# Patient Record
Sex: Female | Born: 1945 | Race: White | Hispanic: No | Marital: Married | Smoking: Never smoker
Health system: Southern US, Community
[De-identification: ages and names within clinical notes are randomized; demographics above are authoritative.]

## PROBLEM LIST (undated history)

## (undated) DIAGNOSIS — Z950 Presence of cardiac pacemaker: Secondary | ICD-10-CM

## (undated) DIAGNOSIS — I10 Essential (primary) hypertension: Secondary | ICD-10-CM

## (undated) DIAGNOSIS — F419 Anxiety disorder, unspecified: Secondary | ICD-10-CM

## (undated) DIAGNOSIS — K219 Gastro-esophageal reflux disease without esophagitis: Secondary | ICD-10-CM

## (undated) DIAGNOSIS — T4145XA Adverse effect of unspecified anesthetic, initial encounter: Secondary | ICD-10-CM

## (undated) DIAGNOSIS — I509 Heart failure, unspecified: Secondary | ICD-10-CM

## (undated) DIAGNOSIS — M797 Fibromyalgia: Secondary | ICD-10-CM

## (undated) DIAGNOSIS — F329 Major depressive disorder, single episode, unspecified: Secondary | ICD-10-CM

## (undated) DIAGNOSIS — F32A Depression, unspecified: Secondary | ICD-10-CM

## (undated) DIAGNOSIS — G4730 Sleep apnea, unspecified: Secondary | ICD-10-CM

## (undated) DIAGNOSIS — M1990 Unspecified osteoarthritis, unspecified site: Secondary | ICD-10-CM

## (undated) DIAGNOSIS — R51 Headache: Secondary | ICD-10-CM

## (undated) HISTORY — PX: TONSILLECTOMY: SUR1361

## (undated) HISTORY — PX: EYE SURGERY: SHX253

---

## 1979-02-06 HISTORY — PX: ECTOPIC PREGNANCY SURGERY: SHX613

## 1988-10-06 HISTORY — PX: FRACTURE SURGERY: SHX138

## 2003-02-06 HISTORY — PX: GASTRIC BYPASS: SHX52

## 2009-02-05 DIAGNOSIS — T8859XA Other complications of anesthesia, initial encounter: Secondary | ICD-10-CM

## 2009-02-05 HISTORY — PX: DILATION AND CURETTAGE OF UTERUS: SHX78

## 2009-02-05 HISTORY — DX: Other complications of anesthesia, initial encounter: T88.59XA

## 2009-02-05 HISTORY — PX: JOINT REPLACEMENT: SHX530

## 2011-02-06 HISTORY — PX: WRIST SURGERY: SHX841

## 2013-06-24 ENCOUNTER — Other Ambulatory Visit: Payer: Self-pay | Admitting: Surgical

## 2013-06-24 MED ORDER — TRANEXAMIC ACID 100 MG/ML IV SOLN: 1000.0000 mg | INTRAVENOUS | Status: AC

## 2013-06-24 NOTE — H&P (Signed)
TOTAL HIP REVISION ADMISSION H&P  Patient is admitted for right revision total hip arthroplasty.  Subjective:  Chief Complaint: right hip pain  HPI: Sylvia Thomas, 68 y.o. female, has a history of pain and functional disability in the right hip due to poor bony ingrowth into the prosthesis and patient has failed non-surgical conservative treatments for greater than 12 weeks to include NSAID's and/or analgesics, flexibility and strengthening excercises, supervised PT with diminished ADL's post treatment, use of assistive devices and activity modification. The indications for the revision total hip arthroplasty are loosening of one or more components.  Onset of symptoms was gradual starting 3 years ago with gradually worsening course since that time.  Prior procedures on the right hip include arthroplasty.  Patient currently rates pain in the right hip at 8 out of 10 with activity.  There is night pain, worsening of pain with activity and weight bearing, pain that interfers with activities of daily living and pain with passive range of motion. Patient has evidence of prosthetic loosening by imaging studies.  This condition presents safety issues increasing the risk of falls.  There is no current active infection.  Past Medical History Chronic Pain Congestive Heart Failure Depression Fibromyalgia Gastroesophageal Reflux Disease Osteoarthritis Osteoporosis Psoriatic Arthritis Sleep Apnea Hypotension Marginal Ulcer Migraines  Past Surgical History Ankle Surgery. left Cataract Surgery. right Dilation and Curettage of Uterus - Multiple Foot Surgery. left Tonsillectomy Total Hip Replacement. right  NKDA  History  Substance Use Topics  . Smoking status: None smoker  . Smokeless tobacco: No use  . Alcohol Use: Rare    Medication History Eszopiclone (3MG  Tablet, Oral) Active. Lyrica (75MG  Capsule, Oral) Active. Carvedilol (3.125MG  Tablet, Oral) Active. Lyrica (75MG  Capsule,  Oral) Active. Pravastatin Sodium (20MG  Tablet, Oral) Active. Lunesta (3MG  Tablet, Oral) Active. Nystop (100000 UNIT/GM Powder, External) Active. Mycostatin (100000 UNIT/GM Powder, External) Active. Furosemide (20MG  Tablet, Oral) Active. Losartan Potassium (25MG  Tablet, Oral) Active. Omeprazole (20MG  Capsule DR, Oral) Active. Cyclobenzaprine HCl (10MG  Tablet, Oral) Active. DULoxetine HCl (60MG  Capsule DR Part, Oral) Active. Cefdinir (300MG  Capsule, Oral) Active. Spironolactone (25MG  Tablet, Oral) Active.  Review of Systems  Constitutional: Positive for malaise/fatigue. Negative for fever, chills, weight loss and diaphoresis.  HENT: Negative.   Eyes: Negative.   Respiratory: Positive for shortness of breath. Negative for cough, hemoptysis, sputum production and wheezing.        SOB on exertion  Cardiovascular: Negative.   Gastrointestinal: Negative.   Genitourinary: Negative.   Musculoskeletal: Positive for back pain, joint pain and myalgias. Negative for falls and neck pain.       Right hip pain  Skin: Negative.   Neurological: Negative.  Negative for weakness.  Endo/Heme/Allergies: Negative.   Psychiatric/Behavioral: Negative for depression, suicidal ideas, hallucinations, memory loss and substance abuse. The patient has insomnia. The patient is not nervous/anxious.     Objective:  Physical Exam  Constitutional: She is oriented to person, place, and time. She appears well-developed and well-nourished. No distress.  HENT:  Head: Normocephalic and atraumatic.  Right Ear: External ear normal.  Left Ear: External ear normal.  Nose: Nose normal.  Mouth/Throat: Oropharynx is clear and moist.  Eyes: Conjunctivae and EOM are normal.  Neck: Normal range of motion. Neck supple.  Cardiovascular: Normal rate, regular rhythm, normal heart sounds and intact distal pulses.   No murmur heard. Respiratory: Effort normal and breath sounds normal. No respiratory distress. She has no wheezes.   GI: Soft. Bowel sounds are normal. She exhibits no distension. There is no  tenderness.  Musculoskeletal:       Right hip: She exhibits decreased strength. She exhibits normal range of motion.       Left hip: Normal.       Right knee: Normal.       Left knee: Normal.       Right lower leg: She exhibits no tenderness and no swelling.       Left lower leg: She exhibits no tenderness and no swelling.  She has excellent range of motion. Right hip flexion to 110, rotation in 30, out 40, abduction 40 without pain on range of motion. Very slight tenderness over the greater trochanter, but that does not reproduce her pain.  Neurological: She is alert and oriented to person, place, and time. She has normal strength and normal reflexes. No sensory deficit.  Skin: No rash noted. She is not diaphoretic. No erythema.  Psychiatric: She has a normal mood and affect. Her behavior is normal.     Imaging Review:  Plain radiographs demonstrate that her femoral stem is in slight varus. There is some increased uptake proximally which would be at the area of in growth and also increased uptake at the tip of the stem. This suggests that there is some motion at the tip of the stem and she does not have good ingrowth proximally.   Assessment/Plan:  End stage arthritis, right hip(s) with failed previous arthroplasty.  The patient history, physical examination, clinical judgement of the provider and imaging studies are consistent with end stage degenerative joint disease of the right hip(s), previous total hip arthroplasty. Revision total hip arthroplasty is deemed medically necessary. The treatment options including medical management, injection therapy, arthroscopy and arthroplasty were discussed at length. The risks and benefits of total hip arthroplasty were presented and reviewed. The risks due to aseptic loosening, infection, stiffness, dislocation/subluxation,  thromboembolic complications and other  imponderables were discussed.  The patient acknowledged the explanation, agreed to proceed with the plan and consent was signed. Patient is being admitted for inpatient treatment for surgery, pain control, PT, OT, prophylactic antibiotics, VTE prophylaxis, progressive ambulation and ADL's and discharge planning. The patient is planning to be discharged home with home health services   TXA ok  Dimitri PedAmber Xavyer Steenson, PA-C

## 2013-06-26 ENCOUNTER — Encounter (HOSPITAL_COMMUNITY): Payer: Self-pay | Admitting: Pharmacy Technician

## 2013-06-29 ENCOUNTER — Other Ambulatory Visit: Payer: Self-pay | Admitting: Orthopedic Surgery

## 2013-07-01 ENCOUNTER — Encounter (HOSPITAL_COMMUNITY): Payer: Self-pay | Admitting: *Deleted

## 2013-07-03 ENCOUNTER — Other Ambulatory Visit (HOSPITAL_COMMUNITY): Payer: Self-pay | Admitting: *Deleted

## 2013-07-08 ENCOUNTER — Inpatient Hospital Stay (HOSPITAL_COMMUNITY): Payer: Medicare Other

## 2013-07-08 ENCOUNTER — Inpatient Hospital Stay (HOSPITAL_COMMUNITY)
Admission: RE | Admit: 2013-07-08 | Discharge: 2013-07-11 | DRG: 470 | Disposition: A | Discharge status: Home Health | Payer: Medicare Other | Source: Ambulatory Visit | Attending: Orthopedic Surgery | Admitting: Orthopedic Surgery

## 2013-07-08 ENCOUNTER — Inpatient Hospital Stay (HOSPITAL_COMMUNITY): Payer: Medicare Other | Admitting: Anesthesiology

## 2013-07-08 ENCOUNTER — Encounter (HOSPITAL_COMMUNITY): Payer: Self-pay | Admitting: *Deleted

## 2013-07-08 ENCOUNTER — Encounter (HOSPITAL_COMMUNITY): Payer: Medicare Other | Admitting: Anesthesiology

## 2013-07-08 ENCOUNTER — Encounter (HOSPITAL_COMMUNITY)
Admission: RE | Disposition: A | Discharge status: Home Health | Payer: Self-pay | Source: Ambulatory Visit | Attending: Orthopedic Surgery

## 2013-07-08 DIAGNOSIS — F329 Major depressive disorder, single episode, unspecified: Secondary | ICD-10-CM | POA: Diagnosis present

## 2013-07-08 DIAGNOSIS — R404 Transient alteration of awareness: Secondary | ICD-10-CM | POA: Diagnosis not present

## 2013-07-08 DIAGNOSIS — L4050 Arthropathic psoriasis, unspecified: Secondary | ICD-10-CM | POA: Diagnosis present

## 2013-07-08 DIAGNOSIS — G8929 Other chronic pain: Secondary | ICD-10-CM | POA: Diagnosis present

## 2013-07-08 DIAGNOSIS — F411 Generalized anxiety disorder: Secondary | ICD-10-CM | POA: Diagnosis present

## 2013-07-08 DIAGNOSIS — Y831 Surgical operation with implant of artificial internal device as the cause of abnormal reaction of the patient, or of later complication, without mention of misadventure at the time of the procedure: Secondary | ICD-10-CM | POA: Diagnosis present

## 2013-07-08 DIAGNOSIS — F3289 Other specified depressive episodes: Secondary | ICD-10-CM | POA: Diagnosis present

## 2013-07-08 DIAGNOSIS — IMO0001 Reserved for inherently not codable concepts without codable children: Secondary | ICD-10-CM | POA: Diagnosis present

## 2013-07-08 DIAGNOSIS — R0689 Other abnormalities of breathing: Secondary | ICD-10-CM

## 2013-07-08 DIAGNOSIS — G4730 Sleep apnea, unspecified: Secondary | ICD-10-CM | POA: Diagnosis present

## 2013-07-08 DIAGNOSIS — T84039A Mechanical loosening of unspecified internal prosthetic joint, initial encounter: Principal | ICD-10-CM | POA: Diagnosis present

## 2013-07-08 DIAGNOSIS — D62 Acute posthemorrhagic anemia: Secondary | ICD-10-CM | POA: Diagnosis not present

## 2013-07-08 DIAGNOSIS — T4275XA Adverse effect of unspecified antiepileptic and sedative-hypnotic drugs, initial encounter: Secondary | ICD-10-CM | POA: Diagnosis not present

## 2013-07-08 DIAGNOSIS — I10 Essential (primary) hypertension: Secondary | ICD-10-CM | POA: Diagnosis present

## 2013-07-08 DIAGNOSIS — T84018A Broken internal joint prosthesis, other site, initial encounter: Secondary | ICD-10-CM

## 2013-07-08 DIAGNOSIS — M169 Osteoarthritis of hip, unspecified: Secondary | ICD-10-CM | POA: Diagnosis present

## 2013-07-08 DIAGNOSIS — G9389 Other specified disorders of brain: Secondary | ICD-10-CM | POA: Diagnosis not present

## 2013-07-08 DIAGNOSIS — M1610 Unilateral primary osteoarthritis, unspecified hip: Secondary | ICD-10-CM | POA: Diagnosis present

## 2013-07-08 DIAGNOSIS — T50905A Adverse effect of unspecified drugs, medicaments and biological substances, initial encounter: Secondary | ICD-10-CM

## 2013-07-08 DIAGNOSIS — Z79899 Other long term (current) drug therapy: Secondary | ICD-10-CM

## 2013-07-08 DIAGNOSIS — K219 Gastro-esophageal reflux disease without esophagitis: Secondary | ICD-10-CM | POA: Diagnosis present

## 2013-07-08 DIAGNOSIS — T40605A Adverse effect of unspecified narcotics, initial encounter: Secondary | ICD-10-CM | POA: Diagnosis not present

## 2013-07-08 DIAGNOSIS — Z96649 Presence of unspecified artificial hip joint: Secondary | ICD-10-CM

## 2013-07-08 DIAGNOSIS — M810 Age-related osteoporosis without current pathological fracture: Secondary | ICD-10-CM | POA: Diagnosis present

## 2013-07-08 HISTORY — DX: Heart failure, unspecified: I50.9

## 2013-07-08 HISTORY — DX: Adverse effect of unspecified anesthetic, initial encounter: T41.45XA

## 2013-07-08 HISTORY — DX: Essential (primary) hypertension: I10

## 2013-07-08 HISTORY — DX: Headache: R51

## 2013-07-08 HISTORY — DX: Fibromyalgia: M79.7

## 2013-07-08 HISTORY — DX: Unspecified osteoarthritis, unspecified site: M19.90

## 2013-07-08 HISTORY — DX: Anxiety disorder, unspecified: F41.9

## 2013-07-08 HISTORY — PX: TOTAL HIP REVISION: SHX763

## 2013-07-08 HISTORY — DX: Depression, unspecified: F32.A

## 2013-07-08 HISTORY — DX: Gastro-esophageal reflux disease without esophagitis: K21.9

## 2013-07-08 HISTORY — DX: Major depressive disorder, single episode, unspecified: F32.9

## 2013-07-08 HISTORY — DX: Sleep apnea, unspecified: G47.30

## 2013-07-08 LAB — ABO/RH: ABO/RH(D): O POS

## 2013-07-08 LAB — TYPE AND SCREEN
ABO/RH(D): O POS
ANTIBODY SCREEN: NEGATIVE

## 2013-07-08 LAB — SURGICAL PCR SCREEN
MRSA, PCR: NEGATIVE
Staphylococcus aureus: NEGATIVE

## 2013-07-08 SURGERY — TOTAL HIP REVISION
Anesthesia: Spinal | Site: Hip | Laterality: Right

## 2013-07-08 MED ORDER — HYDROMORPHONE HCL PF 2 MG/ML IJ SOLN
INTRAMUSCULAR | Status: AC
  Administered 2013-07-08: 2 mg
  Filled 2013-07-08: qty 1

## 2013-07-08 MED ORDER — DEXAMETHASONE 4 MG PO TABS
10.0000 mg | ORAL_TABLET | Freq: Every day | ORAL | Status: AC
  Administered 2013-07-09: 10 mg via ORAL
  Filled 2013-07-08: qty 1

## 2013-07-08 MED ORDER — PANTOPRAZOLE SODIUM 40 MG PO TBEC: 80.0000 mg | DELAYED_RELEASE_TABLET | Freq: Two times a day (BID) | ORAL | Status: DC

## 2013-07-08 MED ORDER — PREGABALIN 75 MG PO CAPS
225.0000 mg | ORAL_CAPSULE | Freq: Every day | ORAL | Status: DC
  Administered 2013-07-08 – 2013-07-10 (×3): 225 mg via ORAL
  Filled 2013-07-08 (×3): qty 3

## 2013-07-08 MED ORDER — POLYETHYLENE GLYCOL 3350 17 G PO PACK: 17.0000 g | PACK | Freq: Every day | ORAL | Status: DC | PRN

## 2013-07-08 MED ORDER — CEFAZOLIN SODIUM-DEXTROSE 2-3 GM-% IV SOLR
INTRAVENOUS | Status: AC
  Filled 2013-07-08: qty 50

## 2013-07-08 MED ORDER — PROPOFOL 10 MG/ML IV BOLUS
INTRAVENOUS | Status: AC
  Filled 2013-07-08: qty 20

## 2013-07-08 MED ORDER — MENTHOL 3 MG MT LOZG: 1.0000 | LOZENGE | OROMUCOSAL | Status: DC | PRN

## 2013-07-08 MED ORDER — ACETAMINOPHEN 10 MG/ML IV SOLN
1000.0000 mg | Freq: Once | INTRAVENOUS | Status: AC
  Administered 2013-07-08: 1000 mg via INTRAVENOUS
  Filled 2013-07-08: qty 100

## 2013-07-08 MED ORDER — CARVEDILOL 3.125 MG PO TABS
3.1250 mg | ORAL_TABLET | Freq: Two times a day (BID) | ORAL | Status: DC
  Administered 2013-07-08 – 2013-07-11 (×6): 3.125 mg via ORAL
  Filled 2013-07-08 (×8): qty 1

## 2013-07-08 MED ORDER — DOCUSATE SODIUM 100 MG PO CAPS
100.0000 mg | ORAL_CAPSULE | Freq: Two times a day (BID) | ORAL | Status: DC
  Administered 2013-07-08 – 2013-07-11 (×6): 100 mg via ORAL

## 2013-07-08 MED ORDER — PHENYLEPHRINE HCL 10 MG/ML IJ SOLN
INTRAMUSCULAR | Status: DC | PRN
Start: 2013-07-08 — End: 2013-07-08
  Administered 2013-07-08 (×2): 80 ug via INTRAVENOUS

## 2013-07-08 MED ORDER — PHENOL 1.4 % MT LIQD: 1.0000 | OROMUCOSAL | Status: DC | PRN

## 2013-07-08 MED ORDER — HYDROMORPHONE HCL PF 2 MG/ML IJ SOLN
INTRAMUSCULAR | Status: AC
  Filled 2013-07-08: qty 1

## 2013-07-08 MED ORDER — OXYCODONE HCL 5 MG PO TABS
5.0000 mg | ORAL_TABLET | ORAL | Status: DC | PRN
  Administered 2013-07-09 (×2): 10 mg via ORAL
  Administered 2013-07-09 (×2): 15 mg via ORAL
  Administered 2013-07-09: 5 mg via ORAL
  Administered 2013-07-09 – 2013-07-11 (×12): 15 mg via ORAL
  Filled 2013-07-08 (×2): qty 3
  Filled 2013-07-08: qty 2
  Filled 2013-07-08 (×6): qty 3
  Filled 2013-07-08: qty 2
  Filled 2013-07-08 (×2): qty 3
  Filled 2013-07-08 (×2): qty 1
  Filled 2013-07-08: qty 2
  Filled 2013-07-08 (×3): qty 3

## 2013-07-08 MED ORDER — PREGABALIN 75 MG PO CAPS: 75.0000 mg | ORAL_CAPSULE | Freq: Two times a day (BID) | ORAL | Status: DC

## 2013-07-08 MED ORDER — ZOLPIDEM TARTRATE 5 MG PO TABS
5.0000 mg | ORAL_TABLET | Freq: Every evening | ORAL | Status: DC | PRN
  Administered 2013-07-09 – 2013-07-10 (×3): 5 mg via ORAL
  Filled 2013-07-08 (×3): qty 1

## 2013-07-08 MED ORDER — BUPIVACAINE HCL (PF) 0.25 % IJ SOLN
INTRAMUSCULAR | Status: DC | PRN
Start: 2013-07-08 — End: 2013-07-08
  Administered 2013-07-08: 20 mL

## 2013-07-08 MED ORDER — PROMETHAZINE HCL 25 MG/ML IJ SOLN: 6.2500 mg | INTRAMUSCULAR | Status: DC | PRN

## 2013-07-08 MED ORDER — ONDANSETRON HCL 4 MG PO TABS
4.0000 mg | ORAL_TABLET | Freq: Four times a day (QID) | ORAL | Status: DC | PRN
  Administered 2013-07-08: 4 mg via ORAL
  Filled 2013-07-08: qty 1

## 2013-07-08 MED ORDER — HYDROMORPHONE HCL PF 1 MG/ML IJ SOLN
INTRAMUSCULAR | Status: DC | PRN
  Administered 2013-07-08 (×4): 0.5 mg via INTRAVENOUS

## 2013-07-08 MED ORDER — MIDAZOLAM HCL 5 MG/5ML IJ SOLN
INTRAMUSCULAR | Status: DC | PRN
  Administered 2013-07-08 (×2): 1 mg via INTRAVENOUS

## 2013-07-08 MED ORDER — HYDROMORPHONE HCL PF 1 MG/ML IJ SOLN
INTRAMUSCULAR | Status: AC
  Filled 2013-07-08: qty 1

## 2013-07-08 MED ORDER — LACTATED RINGERS IV SOLN: INTRAVENOUS | Status: DC

## 2013-07-08 MED ORDER — 0.9 % SODIUM CHLORIDE (POUR BTL) OPTIME
TOPICAL | Status: DC | PRN
  Administered 2013-07-08: 1000 mL

## 2013-07-08 MED ORDER — MORPHINE SULFATE 2 MG/ML IJ SOLN
1.0000 mg | INTRAMUSCULAR | Status: DC | PRN
  Administered 2013-07-08 (×3): 2 mg via INTRAVENOUS
  Administered 2013-07-08 (×2): 1 mg via INTRAVENOUS
  Administered 2013-07-08 (×3): 2 mg via INTRAVENOUS
  Filled 2013-07-08 (×6): qty 1

## 2013-07-08 MED ORDER — METHOCARBAMOL 1000 MG/10ML IJ SOLN
500.0000 mg | Freq: Four times a day (QID) | INTRAVENOUS | Status: DC | PRN
  Administered 2013-07-08 – 2013-07-09 (×2): 500 mg via INTRAVENOUS
  Filled 2013-07-08 (×2): qty 5

## 2013-07-08 MED ORDER — OXYCODONE HCL 5 MG PO TABS
5.0000 mg | ORAL_TABLET | ORAL | Status: DC | PRN
  Administered 2013-07-08 (×2): 10 mg via ORAL
  Administered 2013-07-08 (×2): 5 mg via ORAL
  Filled 2013-07-08: qty 2
  Filled 2013-07-08: qty 1
  Filled 2013-07-08: qty 2
  Filled 2013-07-08: qty 1

## 2013-07-08 MED ORDER — TRANEXAMIC ACID 100 MG/ML IV SOLN
1000.0000 mg | INTRAVENOUS | Status: AC
  Administered 2013-07-08: 1000 mg via INTRAVENOUS
  Filled 2013-07-08: qty 10

## 2013-07-08 MED ORDER — DEXAMETHASONE SODIUM PHOSPHATE 10 MG/ML IJ SOLN
10.0000 mg | Freq: Once | INTRAMUSCULAR | Status: AC
  Administered 2013-07-08: 10 mg via INTRAVENOUS

## 2013-07-08 MED ORDER — PREGABALIN 75 MG PO CAPS
75.0000 mg | ORAL_CAPSULE | Freq: Every day | ORAL | Status: DC
  Administered 2013-07-09 – 2013-07-11 (×3): 75 mg via ORAL
  Filled 2013-07-08 (×3): qty 1

## 2013-07-08 MED ORDER — STERILE WATER FOR IRRIGATION IR SOLN
Status: DC | PRN
  Administered 2013-07-08: 1500 mL

## 2013-07-08 MED ORDER — DEXAMETHASONE SODIUM PHOSPHATE 10 MG/ML IJ SOLN
INTRAMUSCULAR | Status: AC
  Filled 2013-07-08: qty 1

## 2013-07-08 MED ORDER — KETAMINE HCL 10 MG/ML IJ SOLN
INTRAMUSCULAR | Status: AC
  Filled 2013-07-08: qty 1

## 2013-07-08 MED ORDER — BISACODYL 10 MG RE SUPP: 10.0000 mg | Freq: Every day | RECTAL | Status: DC | PRN

## 2013-07-08 MED ORDER — DIPHENHYDRAMINE HCL 12.5 MG/5ML PO ELIX
12.5000 mg | ORAL_SOLUTION | ORAL | Status: DC | PRN
  Administered 2013-07-10: 12.5 mg via ORAL
  Administered 2013-07-11: 25 mg via ORAL
  Filled 2013-07-08: qty 10
  Filled 2013-07-08: qty 5

## 2013-07-08 MED ORDER — BUPIVACAINE LIPOSOME 1.3 % IJ SUSP
INTRAMUSCULAR | Status: DC | PRN
  Administered 2013-07-08: 20 mL

## 2013-07-08 MED ORDER — DULOXETINE HCL 60 MG PO CPEP
60.0000 mg | ORAL_CAPSULE | Freq: Every morning | ORAL | Status: DC
  Administered 2013-07-09 – 2013-07-11 (×3): 60 mg via ORAL
  Filled 2013-07-08 (×3): qty 1

## 2013-07-08 MED ORDER — CEFAZOLIN SODIUM-DEXTROSE 2-3 GM-% IV SOLR
2.0000 g | INTRAVENOUS | Status: AC
Start: 2013-07-08 — End: 2013-07-08
  Administered 2013-07-08: 2 g via INTRAVENOUS

## 2013-07-08 MED ORDER — PROPOFOL 10 MG/ML IV BOLUS
INTRAVENOUS | Status: DC | PRN
  Administered 2013-07-08: 50 mg via INTRAVENOUS

## 2013-07-08 MED ORDER — BUPIVACAINE IN DEXTROSE 0.75-8.25 % IT SOLN
INTRATHECAL | Status: DC | PRN
  Administered 2013-07-08: 2 mL via INTRATHECAL

## 2013-07-08 MED ORDER — DEXAMETHASONE SODIUM PHOSPHATE 10 MG/ML IJ SOLN
10.0000 mg | Freq: Every day | INTRAMUSCULAR | Status: AC
  Filled 2013-07-08: qty 1

## 2013-07-08 MED ORDER — KETAMINE HCL 10 MG/ML IJ SOLN
INTRAMUSCULAR | Status: DC | PRN
  Administered 2013-07-08 (×2): 20 mg via INTRAVENOUS

## 2013-07-08 MED ORDER — CHLORHEXIDINE GLUCONATE 4 % EX LIQD: 60.0000 mL | Freq: Once | CUTANEOUS | Status: DC

## 2013-07-08 MED ORDER — SODIUM CHLORIDE 0.9 % IV SOLN: INTRAVENOUS | Status: DC

## 2013-07-08 MED ORDER — RIVAROXABAN 10 MG PO TABS
10.0000 mg | ORAL_TABLET | Freq: Every day | ORAL | Status: DC
  Administered 2013-07-09 – 2013-07-11 (×3): 10 mg via ORAL
  Filled 2013-07-08 (×4): qty 1

## 2013-07-08 MED ORDER — FENTANYL CITRATE 0.05 MG/ML IJ SOLN
INTRAMUSCULAR | Status: DC | PRN
  Administered 2013-07-08: 100 ug via INTRAVENOUS

## 2013-07-08 MED ORDER — FENTANYL CITRATE 0.05 MG/ML IJ SOLN
INTRAMUSCULAR | Status: AC
  Filled 2013-07-08: qty 2

## 2013-07-08 MED ORDER — ACETAMINOPHEN 325 MG PO TABS: 650.0000 mg | ORAL_TABLET | Freq: Four times a day (QID) | ORAL | Status: DC | PRN

## 2013-07-08 MED ORDER — HYDROMORPHONE HCL PF 1 MG/ML IJ SOLN
0.5000 mg | INTRAMUSCULAR | Status: DC | PRN
  Administered 2013-07-08 – 2013-07-09 (×2): 2 mg via INTRAVENOUS
  Filled 2013-07-08: qty 2

## 2013-07-08 MED ORDER — METHOCARBAMOL 500 MG PO TABS
500.0000 mg | ORAL_TABLET | Freq: Four times a day (QID) | ORAL | Status: DC | PRN
  Administered 2013-07-08 – 2013-07-11 (×6): 500 mg via ORAL
  Filled 2013-07-08 (×6): qty 1

## 2013-07-08 MED ORDER — MORPHINE SULFATE 2 MG/ML IJ SOLN
INTRAMUSCULAR | Status: AC
  Administered 2013-07-08: 1 mg via INTRAVENOUS
  Filled 2013-07-08: qty 1

## 2013-07-08 MED ORDER — TRAMADOL HCL 50 MG PO TABS
50.0000 mg | ORAL_TABLET | Freq: Four times a day (QID) | ORAL | Status: DC | PRN
  Administered 2013-07-09: 50 mg via ORAL
  Filled 2013-07-08: qty 1

## 2013-07-08 MED ORDER — METOCLOPRAMIDE HCL 10 MG PO TABS: 5.0000 mg | ORAL_TABLET | Freq: Three times a day (TID) | ORAL | Status: DC | PRN

## 2013-07-08 MED ORDER — PHENYLEPHRINE 40 MCG/ML (10ML) SYRINGE FOR IV PUSH (FOR BLOOD PRESSURE SUPPORT)
PREFILLED_SYRINGE | INTRAVENOUS | Status: AC
  Filled 2013-07-08: qty 10

## 2013-07-08 MED ORDER — BUPIVACAINE LIPOSOME 1.3 % IJ SUSP
20.0000 mL | Freq: Once | INTRAMUSCULAR | Status: DC
  Filled 2013-07-08: qty 20

## 2013-07-08 MED ORDER — BUPIVACAINE HCL (PF) 0.25 % IJ SOLN
INTRAMUSCULAR | Status: AC
  Filled 2013-07-08: qty 30

## 2013-07-08 MED ORDER — ONDANSETRON HCL 4 MG/2ML IJ SOLN
4.0000 mg | Freq: Four times a day (QID) | INTRAMUSCULAR | Status: DC | PRN
  Administered 2013-07-09: 4 mg via INTRAVENOUS
  Filled 2013-07-08: qty 2

## 2013-07-08 MED ORDER — ACETAMINOPHEN 650 MG RE SUPP: 650.0000 mg | Freq: Four times a day (QID) | RECTAL | Status: DC | PRN

## 2013-07-08 MED ORDER — FLEET ENEMA 7-19 GM/118ML RE ENEM: 1.0000 | ENEMA | Freq: Once | RECTAL | Status: AC | PRN

## 2013-07-08 MED ORDER — METOCLOPRAMIDE HCL 5 MG/ML IJ SOLN
5.0000 mg | Freq: Three times a day (TID) | INTRAMUSCULAR | Status: DC | PRN
  Administered 2013-07-09: 10 mg via INTRAVENOUS
  Filled 2013-07-08: qty 2

## 2013-07-08 MED ORDER — HYDROMORPHONE HCL PF 1 MG/ML IJ SOLN
0.2500 mg | INTRAMUSCULAR | Status: DC | PRN
  Administered 2013-07-08 (×2): 0.5 mg via INTRAVENOUS

## 2013-07-08 MED ORDER — MUPIROCIN 2 % EX OINT: TOPICAL_OINTMENT | Freq: Two times a day (BID) | CUTANEOUS | Status: DC

## 2013-07-08 MED ORDER — LACTATED RINGERS IV SOLN
INTRAVENOUS | Status: DC | PRN
  Administered 2013-07-08 (×3): via INTRAVENOUS

## 2013-07-08 MED ORDER — MIDAZOLAM HCL 2 MG/2ML IJ SOLN
INTRAMUSCULAR | Status: AC
  Filled 2013-07-08: qty 2

## 2013-07-08 MED ORDER — SODIUM CHLORIDE 0.9 % IJ SOLN
INTRAMUSCULAR | Status: AC
  Filled 2013-07-08: qty 50

## 2013-07-08 MED ORDER — MUPIROCIN 2 % EX OINT
TOPICAL_OINTMENT | CUTANEOUS | Status: AC
  Filled 2013-07-08: qty 22

## 2013-07-08 MED ORDER — SODIUM CHLORIDE 0.9 % IV SOLN
INTRAVENOUS | Status: DC
  Administered 2013-07-08 – 2013-07-09 (×2): via INTRAVENOUS

## 2013-07-08 MED ORDER — ACETAMINOPHEN 500 MG PO TABS
1000.0000 mg | ORAL_TABLET | Freq: Four times a day (QID) | ORAL | Status: AC
  Administered 2013-07-08 – 2013-07-09 (×3): 1000 mg via ORAL
  Filled 2013-07-08 (×4): qty 2

## 2013-07-08 MED ORDER — CEFAZOLIN SODIUM-DEXTROSE 2-3 GM-% IV SOLR
2.0000 g | Freq: Four times a day (QID) | INTRAVENOUS | Status: AC
  Administered 2013-07-08 (×2): 2 g via INTRAVENOUS
  Filled 2013-07-08 (×2): qty 50

## 2013-07-08 MED ORDER — PROPOFOL INFUSION 10 MG/ML OPTIME
INTRAVENOUS | Status: DC | PRN
  Administered 2013-07-08: 75 ug/kg/min via INTRAVENOUS

## 2013-07-08 MED ORDER — PHENYLEPHRINE HCL 10 MG/ML IJ SOLN
INTRAMUSCULAR | Status: AC
  Filled 2013-07-08: qty 2

## 2013-07-08 MED ORDER — PANTOPRAZOLE SODIUM 40 MG PO TBEC
40.0000 mg | DELAYED_RELEASE_TABLET | Freq: Two times a day (BID) | ORAL | Status: DC
  Administered 2013-07-08: 40 mg via ORAL
  Filled 2013-07-08 (×4): qty 1

## 2013-07-08 MED ORDER — PHENYLEPHRINE HCL 10 MG/ML IJ SOLN
20.0000 mg | INTRAVENOUS | Status: DC | PRN
  Administered 2013-07-08: 25 ug/min via INTRAVENOUS

## 2013-07-08 SURGICAL SUPPLY — 62 items
BAG ZIPLOCK 12X15 (MISCELLANEOUS) IMPLANT
BIT DRILL 2.8X128 (BIT) ×2 IMPLANT
BIT DRILL 2.8X128MM (BIT) ×1
BLADE EXTENDED COATED 6.5IN (ELECTRODE) ×3 IMPLANT
BLADE SAW SAG 73X25 THK (BLADE) ×2
BLADE SAW SGTL 73X25 THK (BLADE) ×1 IMPLANT
CABLE (Orthopedic Implant) ×6 IMPLANT
CLOSURE WOUND 1/4X4 (GAUZE/BANDAGES/DRESSINGS) ×1
DRAPE C-ARM 42X120 X-RAY (DRAPES) ×3 IMPLANT
DRAPE INCISE IOBAN 66X45 STRL (DRAPES) ×3 IMPLANT
DRAPE ORTHO SPLIT 77X108 STRL (DRAPES) ×4
DRAPE POUCH INSTRU U-SHP 10X18 (DRAPES) ×3 IMPLANT
DRAPE SURG ORHT 6 SPLT 77X108 (DRAPES) ×2 IMPLANT
DRAPE U-SHAPE 47X51 STRL (DRAPES) ×3 IMPLANT
DRSG EMULSION OIL 3X16 NADH (GAUZE/BANDAGES/DRESSINGS) IMPLANT
DRSG EMULSION OIL 3X3 NADH (GAUZE/BANDAGES/DRESSINGS) ×3 IMPLANT
DRSG MEPILEX BORDER 4X4 (GAUZE/BANDAGES/DRESSINGS) IMPLANT
DRSG MEPILEX BORDER 4X8 (GAUZE/BANDAGES/DRESSINGS) ×3 IMPLANT
DURAPREP 26ML APPLICATOR (WOUND CARE) ×3 IMPLANT
ELECT REM PT RETURN 9FT ADLT (ELECTROSURGICAL) ×3
ELECTRODE REM PT RTRN 9FT ADLT (ELECTROSURGICAL) ×1 IMPLANT
EVACUATOR 1/8 PVC DRAIN (DRAIN) ×3 IMPLANT
FACESHIELD WRAPAROUND (MASK) ×15 IMPLANT
GLOVE BIO SURGEON STRL SZ7.5 (GLOVE) ×3 IMPLANT
GLOVE BIO SURGEON STRL SZ8 (GLOVE) ×3 IMPLANT
GLOVE BIOGEL PI IND STRL 8 (GLOVE) ×3 IMPLANT
GLOVE BIOGEL PI INDICATOR 8 (GLOVE) ×6
GLOVE SURG SS PI 6.5 STRL IVOR (GLOVE) ×6 IMPLANT
GOWN STRL REUS W/TWL LRG LVL3 (GOWN DISPOSABLE) ×3 IMPLANT
GOWN STRL REUS W/TWL XL LVL3 (GOWN DISPOSABLE) ×9 IMPLANT
HEAD FEM STD 32X+13 STRL (Hips) ×3 IMPLANT
IMMOBILIZER KNEE 20 (SOFTGOODS) IMPLANT
KIT BASIN OR (CUSTOM PROCEDURE TRAY) ×3 IMPLANT
MANIFOLD NEPTUNE II (INSTRUMENTS) ×3 IMPLANT
NDL SAFETY ECLIPSE 18X1.5 (NEEDLE) IMPLANT
NEEDLE HYPO 18GX1.5 SHARP (NEEDLE)
NEEDLE HYPO 22GX1.5 SAFETY (NEEDLE) ×6 IMPLANT
NS IRRIG 1000ML POUR BTL (IV SOLUTION) ×3 IMPLANT
PACK TOTAL JOINT (CUSTOM PROCEDURE TRAY) ×3 IMPLANT
PASSER SUT SWANSON 36MM LOOP (INSTRUMENTS) ×3 IMPLANT
POSITIONER SURGICAL ARM (MISCELLANEOUS) ×3 IMPLANT
SPONGE GAUZE 4X4 12PLY (GAUZE/BANDAGES/DRESSINGS) ×3 IMPLANT
SPONGE LAP 18X18 X RAY DECT (DISPOSABLE) ×3 IMPLANT
STAPLER VISISTAT 35W (STAPLE) ×3 IMPLANT
STRIP CLOSURE SKIN 1/4X4 (GAUZE/BANDAGES/DRESSINGS) ×2 IMPLANT
SUCTION FRAZIER TIP 10 FR DISP (SUCTIONS) ×3 IMPLANT
SUT ETHIBOND NAB CT1 #1 30IN (SUTURE) ×6 IMPLANT
SUT MNCRL AB 4-0 PS2 18 (SUTURE) ×3 IMPLANT
SUT VIC AB 1 CT1 27 (SUTURE) ×6
SUT VIC AB 1 CT1 27XBRD ANTBC (SUTURE) ×3 IMPLANT
SUT VIC AB 2-0 CT1 27 (SUTURE) ×6
SUT VIC AB 2-0 CT1 TAPERPNT 27 (SUTURE) ×3 IMPLANT
SUT VLOC 180 0 24IN GS25 (SUTURE) ×6 IMPLANT
SWAB COLLECTION DEVICE MRSA (MISCELLANEOUS) IMPLANT
SYR 20CC LL (SYRINGE) ×3 IMPLANT
SYR 50ML LL SCALE MARK (SYRINGE) IMPLANT
SYS SOLUTION 13.5 LG EXP CAGE (Hips) ×3 IMPLANT
SYSTEM SOLUTN 13.5 LG EXP CAGE (Hips) ×1 IMPLANT
TOWEL OR 17X26 10 PK STRL BLUE (TOWEL DISPOSABLE) ×6 IMPLANT
TRAY FOLEY CATH 14FRSI W/METER (CATHETERS) ×3 IMPLANT
TUBE ANAEROBIC SPECIMEN COL (MISCELLANEOUS) IMPLANT
WATER STERILE IRR 1500ML POUR (IV SOLUTION) ×3 IMPLANT

## 2013-07-08 NOTE — Progress Notes (Signed)
Pt c/o right hip pain. Pain medication w/o any relief. Called md on call awaiting call back.

## 2013-07-08 NOTE — Anesthesia Preprocedure Evaluation (Addendum)
Anesthesia Evaluation  Patient identified by MRN, date of birth, ID band Patient awake    Reviewed: Allergy & Precautions, H&P , NPO status , Patient's Chart, lab work & pertinent test results  History of Anesthesia Complications (+) history of anesthetic complications  Airway Mallampati: II TM Distance: >3 FB Neck ROM: Full    Dental  (+) Teeth Intact, Dental Advisory Given   Pulmonary sleep apnea ,  breath sounds clear to auscultation- rhonchi  Pulmonary exam normal       Cardiovascular hypertension, Pt. on medications and Pt. on home beta blockers +CHF Rhythm:Regular Rate:Normal     Neuro/Psych  Headaches, Anxiety Depression    GI/Hepatic Neg liver ROS, GERD-  Medicated,  Endo/Other  negative endocrine ROS  Renal/GU negative Renal ROS  negative genitourinary   Musculoskeletal  (+) Fibromyalgia -, narcotic dependent  Abdominal   Peds  Hematology negative hematology ROS (+)   Anesthesia Other Findings   Reproductive/Obstetrics                         Anesthesia Physical Anesthesia Plan  ASA: II  Anesthesia Plan: Spinal   Post-op Pain Management:    Induction: Intravenous  Airway Management Planned: Simple Face Mask  Additional Equipment:   Intra-op Plan:   Post-operative Plan:   Informed Consent: I have reviewed the patients History and Physical, chart, labs and discussed the procedure including the risks, benefits and alternatives for the proposed anesthesia with the patient or authorized representative who has indicated his/her understanding and acceptance.   Dental advisory given  Plan Discussed with: CRNA  Anesthesia Plan Comments:         Anesthesia Quick Evaluation

## 2013-07-08 NOTE — Anesthesia Procedure Notes (Signed)
Spinal  Patient location during procedure: OR Start time: 07/08/2013 10:41 AM End time: 07/08/2013 10:46 AM Staffing CRNA/Resident: Raelyn Number J Performed by: resident/CRNA  Preanesthetic Checklist Completed: patient identified, surgical consent, pre-op evaluation, timeout performed, IV checked, risks and benefits discussed and monitors and equipment checked Spinal Block Patient position: sitting Prep: Betadine and site prepped and draped Patient monitoring: heart rate, continuous pulse ox and blood pressure Approach: midline Location: L3-4 Injection technique: single-shot Needle Needle type: Sprotte  Needle gauge: 24 G Needle length: 10 cm Additional Notes Lot #[id number]  Exp. 2016/09     Smooth placement with no heme or paresthesia noted.  Patient tolerated well.  Good CSF return noted.

## 2013-07-08 NOTE — Plan of Care (Signed)
Problem: Consults Goal: Diagnosis- Total Joint Replacement Primary Total Hip Posterior

## 2013-07-08 NOTE — Transfer of Care (Signed)
Immediate Anesthesia Transfer of Care Note  Patient: Sylvia Thomas  Procedure(s) Performed: Procedure(s): RIGHT HIP FEMORAL REVISION (Right)  Patient Location: PACU  Anesthesia Type:Spinal  Level of Consciousness: awake, alert , oriented and patient cooperative  Airway & Oxygen Therapy: Patient Spontanous Breathing and Patient connected to face mask oxygen  Post-op Assessment: Report given to PACU RN, Post -op Vital signs reviewed and stable and SAB level T12.  Post vital signs: Reviewed and stable  Complications: No apparent anesthesia complications

## 2013-07-08 NOTE — Progress Notes (Signed)
Utilization review completed.  

## 2013-07-08 NOTE — Brief Op Note (Signed)
  07/08/2013  12:48 PM  PATIENT:  Sylvia Thomas  68 y.o. 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   PROCEDURE:  Procedure(s): RIGHT HIP FEMORAL REVISION (Right)  SURGEON:  Surgeon(s) and Role:    * Loanne Drilling, MD - Primary  PHYSICIAN ASSISTANT:   ASSISTANTS: Avel Peace, PA-C   ANESTHESIA:   spinal  EBL:  Total I/O In: 1000 [I.V.:1000] Out: 500 [Urine:50; Blood:450]  BLOOD ADMINISTERED:none  DRAINS: (Medium) Hemovact drain(s) in the right hip with  Suction Open   LOCAL MEDICATIONS USED:  OTHER Exparel  COUNTS:  YES  TOURNIQUET:  * No tourniquets in log *  DICTATION: .Other Dictation: Dictation Number [id number]  PLAN OF CARE: Admit to inpatient   PATIENT DISPOSITION:  PACU - hemodynamically stable.

## 2013-07-08 NOTE — Interval H&P Note (Signed)
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 After consideration of risks, benefits and other options for treatment, the patient has consented to  Procedure(s): RIGHT HIP FEMORAL VS  TOTAL HIP ARTHOPLASTY  REVISION (Right) as a surgical intervention .  The patient's history has been reviewed, patient examined, no change in status, stable for surgery.  I have reviewed the patient's chart and labs.  Questions were answered to the patient's satisfaction.     Homero Fellers Marina Desire V

## 2013-07-08 NOTE — Anesthesia Postprocedure Evaluation (Signed)
Anesthesia Post Note  Patient: Sylvia Thomas  Procedure(s) Performed: Procedure(s) (LRB): RIGHT HIP FEMORAL REVISION (Right)  Anesthesia type: Spinal  Patient location: PACU  Post pain: Pain level controlled  Post assessment: Post-op Vital signs reviewed  Last Vitals:  Filed Vitals:   07/08/13 1337  BP: 143/80  Pulse:   Temp:   Resp:     Post vital signs: Reviewed  Level of consciousness: sedated  Complications: No apparent anesthesia complications

## 2013-07-09 ENCOUNTER — Encounter (HOSPITAL_COMMUNITY): Payer: Self-pay | Admitting: Orthopedic Surgery

## 2013-07-09 DIAGNOSIS — R0689 Other abnormalities of breathing: Secondary | ICD-10-CM

## 2013-07-09 DIAGNOSIS — R404 Transient alteration of awareness: Secondary | ICD-10-CM | POA: Diagnosis not present

## 2013-07-09 DIAGNOSIS — T50905A Adverse effect of unspecified drugs, medicaments and biological substances, initial encounter: Secondary | ICD-10-CM

## 2013-07-09 DIAGNOSIS — T40605A Adverse effect of unspecified narcotics, initial encounter: Secondary | ICD-10-CM | POA: Diagnosis not present

## 2013-07-09 DIAGNOSIS — D62 Acute posthemorrhagic anemia: Secondary | ICD-10-CM | POA: Diagnosis not present

## 2013-07-09 LAB — BASIC METABOLIC PANEL
BUN: 12 mg/dL (ref 6–23)
CALCIUM: 8.5 mg/dL (ref 8.4–10.5)
CO2: 24 mEq/L (ref 19–32)
CREATININE: 0.69 mg/dL (ref 0.50–1.10)
Chloride: 100 mEq/L (ref 96–112)
GFR calc Af Amer: >90 mL/min (ref >90)
GFR calc non Af Amer: 89 mL/min — ABNORMAL LOW (ref >90)
GLUCOSE: 188 mg/dL — AB (ref 70–99)
Potassium: 4.4 mEq/L (ref 3.7–5.3)
Sodium: 134 mEq/L — ABNORMAL LOW (ref 137–147)

## 2013-07-09 LAB — CBC
HEMATOCRIT: 34.6 % — AB (ref 36.0–46.0)
HEMOGLOBIN: 11.1 g/dL — AB (ref 12.0–15.0)
MCH: 29.1 pg (ref 26.0–34.0)
MCHC: 32.1 g/dL (ref 30.0–36.0)
MCV: 90.6 fL (ref 78.0–100.0)
Platelets: 188 10*3/uL (ref 150–400)
RBC: 3.82 MIL/uL — ABNORMAL LOW (ref 3.87–5.11)
RDW: 13.7 % (ref 11.5–15.5)
WBC: 12.2 10*3/uL — AB (ref 4.0–10.5)

## 2013-07-09 MED ORDER — NALOXONE HCL 0.4 MG/ML IJ SOLN
0.4000 mg | Freq: Once | INTRAMUSCULAR | Status: AC
  Administered 2013-07-09: 0.4 mg via INTRAVENOUS

## 2013-07-09 MED ORDER — FUROSEMIDE 10 MG/ML IJ SOLN
10.0000 mg | Freq: Once | INTRAMUSCULAR | Status: AC
  Administered 2013-07-09: 10 mg via INTRAVENOUS
  Filled 2013-07-09: qty 1

## 2013-07-09 MED ORDER — NALOXONE HCL 0.4 MG/ML IJ SOLN: 0.4000 mg | INTRAMUSCULAR | Status: DC | PRN

## 2013-07-09 MED ORDER — OMEPRAZOLE 20 MG PO CPDR
40.0000 mg | DELAYED_RELEASE_CAPSULE | Freq: Two times a day (BID) | ORAL | Status: DC
  Administered 2013-07-09 – 2013-07-11 (×5): 40 mg via ORAL
  Filled 2013-07-09 (×7): qty 2

## 2013-07-09 MED ORDER — MORPHINE SULFATE 2 MG/ML IJ SOLN
1.0000 mg | INTRAMUSCULAR | Status: DC | PRN
  Administered 2013-07-09: 2 mg via INTRAVENOUS
  Administered 2013-07-09: 1 mg via INTRAVENOUS
  Administered 2013-07-09: 2 mg via INTRAVENOUS
  Filled 2013-07-09 (×3): qty 1

## 2013-07-09 NOTE — Discharge Instructions (Addendum)
Dr. Gaynelle Arabian Total Joint Specialist [facility] [address], [place], [place] [address] [phone]   TOTAL HIP REPLACEMENT POSTOPERATIVE DIRECTIONS    Hip Rehabilitation, Guidelines Following Surgery  The results of a hip operation are greatly improved after range of motion and muscle strengthening exercises. Follow all safety measures which are given to protect your hip. If any of these exercises cause increased pain or swelling in your joint, decrease the amount until you are comfortable again. Then slowly increase the exercises. Call your caregiver if you have problems or questions.  HOME CARE INSTRUCTIONS  Most of the following instructions are designed to prevent the dislocation of your new hip.  Remove items at home which could result in a fall. This includes throw rugs or furniture in walking pathways.  Continue medications as instructed at time of discharge.  You may have some home medications which will be placed on hold until you complete the course of blood thinner medication.  You may start showering once you are discharged home but do not submerge the incision under water. Just pat the incision dry and apply a dry gauze dressing on daily. Do not put on socks or shoes without following the instructions of your caregivers.  Sit on high chairs so your hips are not bent more than 90 degrees.  Sit on chairs with arms. Use the chair arms to help push yourself up when arising.  Keep your leg on the side of the operation out in front of you when standing up.  Arrange for the use of a toilet seat elevator so you are not sitting low.  Do not do any exercises or get in any positions that cause your toes to point in (pigeon toed).  Always sleep with a pillow between your legs. Do not lie on your side in sleep with both knees touching the bed.   Walk with walker as instructed.  You may resume a sexual relationship in one month or when given the OK by  your caregiver.  Use walker as long as suggested by your caregivers.  You may put full weight on your legs and walk as much as is comfortable. Avoid periods of inactivity such as sitting longer than an hour when not asleep. This helps prevent blood clots.  You may return to work once you are cleared by Engineer, production.  Do not drive a car for 6 weeks or until released by your surgeon.  Do not drive while taking narcotics.  Wear elastic stockings for three weeks following surgery during the day but you may remove then at night.  Make sure you keep all of your appointments after your operation with all of your doctors and caregivers. You should call the office at the above phone number and make an appointment for approximately two weeks after the date of your surgery. Change the dressing daily and reapply a dry dressing each time. Please pick up a stool softener and laxative for home use as long as you are requiring pain medications.  Continue to use ice on the hip for pain and swelling from surgery. You may notice swelling that will progress down to the foot and ankle.  This is normal after  surgery.  Elevate the leg when you are not up walking on it.   It is important for you to complete the blood thinner medication as prescribed by your doctor.  Continue to use the breathing machine which will help keep your temperature down.  It  is common for your temperature to cycle up and down following surgery, especially at night when you are not up moving around and exerting yourself.  The breathing machine keeps your lungs expanded and your temperature down.  RANGE OF MOTION AND STRENGTHENING EXERCISES  These exercises are designed to help you keep full movement of your hip joint. Follow your caregiver's or physical therapist's instructions. Perform all exercises about fifteen times, three times per day or as directed. Exercise both hips, even if you have had only one joint replacement. These exercises can be  done on a training (exercise) mat, on the floor, on a table or on a bed. Use whatever works the best and is most comfortable for you. Use music or television while you are exercising so that the exercises are a pleasant break in your day. This will make your life better with the exercises acting as a break in routine you can look forward to.  Lying on your back, slowly slide your foot toward your buttocks, raising your knee up off the floor. Then slowly slide your foot back down until your leg is straight again.  Lying on your back spread your legs as far apart as you can without causing discomfort.  Lying on your side, raise your upper leg and foot straight up from the floor as far as is comfortable. Slowly lower the leg and repeat.  Lying on your back, tighten up the muscle in the front of your thigh (quadriceps muscles). You can do this by keeping your leg straight and trying to raise your heel off the floor. This helps strengthen the largest muscle supporting your knee.  Lying on your back, tighten up the muscles of your buttocks both with the legs straight and with the knee bent at a comfortable angle while keeping your heel on the floor.   SKILLED REHAB INSTRUCTIONS: If the patient is transferred to a skilled rehab facility following release from the hospital, a list of the current medications will be sent to the facility for the patient to continue.  When discharged from the skilled rehab facility, please have the facility set up the patient's Home Health Physical Therapy prior to being released. Also, the skilled facility will be responsible for providing the patient with their medications at time of release from the facility to include their pain medication, the muscle relaxants, and their blood thinner medication. If the patient is still at the rehab facility at time of the two week follow up appointment, the skilled rehab facility will also need to assist the patient in arranging follow up  appointment in our office and any transportation needs.  MAKE SURE YOU:  Understand these instructions.  Will watch your condition.  Will get help right away if you are not doing well or get worse.  Pick up stool softner and laxative for home. Do not submerge incision under water. May shower. Continue to use ice for pain and swelling from surgery. Hip precautions.  Total Hip Protocol.  Take Xarelto for two and a half more weeks, then discontinue Xarelto. Once the patient has completed the Xarelto, they may resume the 81 mg Aspirin.   Information on my medicine - XARELTO (Rivaroxaban)  This medication education was reviewed with me or my healthcare representative as part of my discharge preparation.  The pharmacist that spoke with me during my hospital stay was:  Rollene Fare, [facility]  Why was Xarelto prescribed for you? Xarelto was prescribed for you to reduce the risk  of blood clots forming after orthopedic surgery. The medical term for these abnormal blood clots is venous thromboembolism (VTE).  What do you need to know about xarelto ? Take your Xarelto ONCE DAILY at the same time every day. You may take it either with or without food.  If you have difficulty swallowing the tablet whole, you may crush it and mix in applesauce just prior to taking your dose.  Take Xarelto exactly as prescribed by your doctor and DO NOT stop taking Xarelto without talking to the doctor who prescribed the medication.  Stopping without other VTE prevention medication to take the place of Xarelto may increase your risk of developing a clot.  After discharge, you should have regular check-up appointments with your healthcare provider that is prescribing your Xarelto.    What do you do if you miss a dose? If you miss a dose, take it as soon as you remember on the same day then continue your regularly scheduled once daily regimen the next day. Do not take two doses of Xarelto on the same day.    Important Safety Information A possible side effect of Xarelto is bleeding. You should call your healthcare provider right away if you experience any of the following:   Bleeding from an injury or your nose that does not stop.   Unusual colored urine (red or dark brown) or unusual colored stools (red or black).   Unusual bruising for unknown reasons.   A serious fall or if you hit your head (even if there is no bleeding).  Some medicines may interact with Xarelto and might increase your risk of bleeding while on Xarelto. To help avoid this, consult your healthcare provider or pharmacist prior to using any new prescription or non-prescription medications, including herbals, vitamins, non-steroidal anti-inflammatory drugs (NSAIDs) and supplements.  This website has more information on Xarelto: [email or web address]

## 2013-07-09 NOTE — Progress Notes (Signed)
Spoke with pt's husband and informed of pt's care.

## 2013-07-09 NOTE — Evaluation (Signed)
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  Clinical Impression  Pt is s/p R THA revision with posterior hip precautions resulting in the deficits listed below (see PT Problem List).  Pt will benefit from skilled PT to increase their independence and safety with mobility to allow discharge to the venue listed below.  Pt slow to mobilize this morning due to pain however plans to d/c home with spouse available 24/7.     Follow Up Recommendations Home health PT;Supervision for mobility/OOB    Equipment Recommendations  None recommended by PT (plans to get DME from [employer])    Recommendations for Other Services       Precautions / Restrictions Precautions Precautions: Fall;Posterior Hip Precaution Comments: pt educated on posterior hip precautions Restrictions Other Position/Activity Restrictions: WBAT      Mobility  Bed Mobility Overal bed mobility: Needs Assistance Bed Mobility: Supine to Sit     Supine to sit: Min assist;HOB elevated     General bed mobility comments: verbal cues for technique, assist for R LE  Transfers Overall transfer level: Needs assistance Equipment used: Rolling walker (2 wheeled) Transfers: Sit to/from Stand Sit to Stand: +2 physical assistance;Mod assist;+2 safety/equipment;From elevated surface         General transfer comment: verbal cues for safe technique within precautions, pt unable to push up from bed so had bil hands on RW with PT holding down RW  Ambulation/Gait Ambulation/Gait assistance: Min guard Ambulation Distance (Feet): 15 Feet Assistive device: Rolling walker (2 wheeled) Gait Pattern/deviations: Step-to pattern;Decreased step length - right;Antalgic     General Gait Details: verbal cues for sequence, RW distance, posture; distance  limited by UE fatigue and R LE pain  Stairs            Wheelchair Mobility    Modified Rankin (Stroke Patients Only)       Balance                                             Pertinent Vitals/Pain Max c/o R hip pain with mobility, activity to tolerance, RN notified    Home Living Family/patient expects to be discharged to:: Private residence Living Arrangements: Spouse/significant other   Type of Home: House Home Access: Level entry     Home Layout: One level Home Equipment: Environmental consultant - 2 wheels;Bedside commode Additional Comments: pt reports her church loans equipment     Prior Function Level of Independence: Independent               Hand Dominance        Extremity/Trunk Assessment               Lower Extremity Assessment: RLE deficits/detail RLE Deficits / Details: functional hip weakness observed, assist required for bed mobility       Communication   Communication: No difficulties  Cognition Arousal/Alertness: Awake/alert Behavior During Therapy: WFL for tasks assessed/performed Overall Cognitive Status: Within Functional Limits for tasks assessed                      General Comments      Exercises        Assessment/Plan    PT Assessment  Patient needs continued PT services  PT Diagnosis Difficulty walking;Acute pain   PT Problem List Decreased strength;Decreased mobility;Decreased knowledge of precautions;Pain;Decreased knowledge of use of DME;Decreased activity tolerance  PT Treatment Interventions Functional mobility training;Patient/family education;DME instruction;Gait training;Therapeutic activities;Therapeutic exercise   PT Goals (Current goals can be found in the Care Plan section) Acute Rehab PT Goals PT Goal Formulation: With patient Time For Goal Achievement: 07/14/13 Potential to Achieve Goals: Good    Frequency 7X/week   Barriers to discharge        Co-evaluation                End of Session   Activity Tolerance: Patient limited by fatigue;Patient limited by pain Patient left: in chair;with call bell/phone within reach Nurse Communication: Mobility status         Time: 1011-1030 PT Time Calculation (min): 19 min   Charges:   PT Evaluation $Initial PT Evaluation Tier I: 1 Procedure PT Treatments $Gait Training: 8-22 mins   PT G CodesLynnell Catalan:          Marshel Golubski E Jovany Disano 07/09/2013, 12:51 PM Zenovia JarredKati Quinn Quam, PT, DPT 07/09/2013 Pager: [phone]

## 2013-07-09 NOTE — Care Management Note (Signed)
    Page 1 of 1   07/09/2013     12:49:02 PM CARE MANAGEMENT NOTE 07/09/2013  Patient:  Sylvia Thomas, Sylvia Thomas   Account Number:  [id number]  Date Initiated:  07/09/2013  Documentation initiated by:  Lorenda Ishihara  Subjective/Objective Assessment:   68 yo female admitted s/p Right femoral revision of total hip arthroplasty. PTA lived at home with spouse.     Action/Plan:   Home when stable   Anticipated DC Date:  07/11/2013   Anticipated DC Plan:  HOME W HOME HEALTH SERVICES      DC Planning Services  CM consult      [facility] Choice  HOME HEALTH   Choice offered to / List presented to:  C-1 Patient        HH arranged  HH-2 PT      HH agency  OTHER - SEE NOTE   Status of service:  Completed, signed off Medicare Important Message given?  NA - LOS <3 / Initial given by admissions (If response is "NO", the following Medicare IM given date fields will be blank) Date Medicare IM given:   Date Additional Medicare IM given:    Discharge Disposition:  HOME W HOME HEALTH SERVICES  Per UR Regulation:  Reviewed for med. necessity/level of care/duration of stay  If discussed at Long Length of Stay Meetings, dates discussed:    Comments:  07-09-13 Lorenda Ishihara RN CM 1200 Spoke with patient at bedside. States she has used [employer] in the past, she is thinking of a Development worker, community but can not remember name. If she can't find information on private therapist she is fine with [facility]. Contacted [facility] and they are able to take her with the start of service on Saturday. Information faxed to them. Provided patient with my contact information in case she can find other information to go with the private therapist.

## 2013-07-09 NOTE — Progress Notes (Signed)
OT Cancellation Note  Patient Details Name: Sylvia Thomas MRN: [id number] DOB: October 07, 1945   Cancelled Treatment:    Reason Eval/Treat Not Completed: Other (comment). Pt back to bed; will return tomorrow to complete OT eval.    Marica Otter 07/09/2013, 3:01 PM Marica Otter, OTR/L [phone] 07/09/2013

## 2013-07-09 NOTE — Progress Notes (Signed)
Physical Therapy Treatment Note   07/09/13 1600  PT Visit Information  Last PT Received On 07/09/13  Assistance Needed +1  History of Present Illness Pt is a 68 year old female s/p right femoral revision of total hip arthroplasty.  PT Time Calculation  PT Start Time 1321  PT Stop Time 1347  PT Time Calculation (min) 26 min  Subjective Data  Subjective Pt ambulated again and assisted back to bed.  Pt also performed exercises in bed.  Precautions  Precautions Fall;Posterior Hip  Restrictions  Other Position/Activity Restrictions WBAT  Cognition  Arousal/Alertness Awake/alert  Behavior During Therapy WFL for tasks assessed/performed  Overall Cognitive Status Within Functional Limits for tasks assessed  Bed Mobility  Overal bed mobility Needs Assistance  Bed Mobility Sit to Supine  Sit to supine Mod assist  General bed mobility comments assist for LEs onto bed, verbal cues for technique  Transfers  Overall transfer level Needs assistance  Equipment used Rolling walker (2 wheeled)  Transfers Sit to/from Stand  Sit to Stand Min assist;+2 safety/equipment  General transfer comment verbal cues for technique, pt able to improve transfer to stand using armrests  Ambulation/Gait  Ambulation/Gait assistance Min guard  Ambulation Distance (Feet) 40 Feet  Assistive device Rolling walker (2 wheeled)  Gait Pattern/deviations Step-to pattern;Antalgic;Decreased stance time - right;Trunk flexed  General Gait Details verbal cues for sequence, RW distance, posture; pt reports pain improved since this morning  Exercises  Exercises Total Joint  Total Joint Exercises  Ankle Circles/Pumps AROM;15 reps;Both  Quad Sets AROM;Both;10 reps  Gluteal Sets AROM;Both;10 reps  Towel Squeeze AROM;Both;10 reps  Short Arc [employer];Right;10 reps  Heel Slides AAROM;Right;10 reps;Other (comment) (within precautions)  Hip ABduction/ADduction AAROM;Right;10 reps  PT - End of Session  Activity Tolerance  Patient limited by pain  Patient left in bed;with call bell/phone within reach  PT - Assessment/Plan  PT Plan Current plan remains appropriate  PT Frequency 7X/week  Follow Up Recommendations Home health PT;Supervision for mobility/OOB  PT equipment None recommended by PT  PT Goal Progression  Progress towards PT goals Progressing toward goals  PT General Charges  $$ ACUTE PT VISIT 1 Procedure  PT Treatments  $Gait Training 8-22 mins  $Therapeutic Exercise 8-22 mins  Pt premedicated however spoke with RN about request for muscle relaxer.  Ice pack applied end of session.  Zenovia Jarred, PT, DPT 07/09/2013 Pager: [phone]

## 2013-07-09 NOTE — Significant Event (Signed)
Rapid Response Event Note  Overview: Time Called: 0245 Arrival Time: 0250 Event Type: Respiratory  Initial Focused Assessment: Patient unresponsive on arrival with NRB on and sats low 80's. Pain had been very difficult to mange and had received diluadid 2mg  within 45 minutes. Respirations were very shallow. BP elevated and in ST 100's. Patient woke and gradually was very alert.    Interventions: Bag assissted ventilation for several minutes while narcan was given slowly. PA was notified. Observed for 1 1/2 hours after narcan for respiratory suppression and observed patient able to doze and wake easily. Appears to have sleep apnea at times with sats drifting to 87% then up to 97%.    Event Summary: Name of Physician Notified: MATHEW BABISH PA at 0310    at    Outcome: Stayed in room and stabalized     Bayard Hugger

## 2013-07-09 NOTE — Progress Notes (Signed)
   Subjective: 1 Day Post-Op Procedure(s) (LRB): RIGHT HIP FEMORAL REVISION (Right) Patient reports pain as moderate and severe last night.   Patient seen in rounds with Dr. Lequita Halt.  Briefly discussed the surgery and procedure with the patient. She became oversedated last night and required Narcan administration.  DC IV dilaudid and switch back to a low dose Morphine.  Encourage PO pain meds. Patient is having problems with pain in the hip, requiring pain medications We will start therapy today.  Plan is to go Home after hospital stay.  Objective: Vital signs in last 24 hours: Temp:  [96.4 F (35.8 C)-98.5 F (36.9 C)] 97.7 F (36.5 C) (06/04 0602) Pulse Rate:  [76-101] 101 (06/04 0602) Resp:  [6-18] 18 (06/04 0800) BP: (116-173)/(69-98) 131/85 mmHg (06/04 0602) SpO2:  [82 %-100 %] 99 % (06/04 0800)  Intake/Output from previous day:  Intake/Output Summary (Last 24 hours) at 07/09/13 0848 Last data filed at 07/09/13 0600  Gross per 24 hour  Intake 4663.75 ml  Output   2180 ml  Net 2483.75 ml    Intake/Output this shift: UOP 325 since MN +2483  Labs: Preop HGB on outpatient basis was 13.7 starting out  Recent Labs  07/09/13 0336  HGB 11.1*    Recent Labs  07/09/13 0336  WBC 12.2*  RBC 3.82*  HCT 34.6*  PLT 188    Recent Labs  07/09/13 0336  NA 134*  K 4.4  CL 100  CO2 24  BUN 12  CREATININE 0.69  GLUCOSE 188*  CALCIUM 8.5   No results found for this basename: LABPT, INR,  in the last 72 hours  EXAM General - Patient is Alert and Appropriate Extremity - Neurovascular intact Sensation intact distally Dorsiflexion/Plantar flexion intact Dressing - dressing C/D/I Motor Function - intact, moving foot and toes well on exam.  Hemovac pulled without difficulty.  Past Medical History  Diagnosis Date  . Complication of anesthesia 2011    stopped breathing in recovery and was put on CPAP machine in PACU after hip surgery  . CHF (congestive heart  failure)   . Hypertension   . Sleep apnea     does not use CPAP  . Depression   . Anxiety   . GERD (gastroesophageal reflux disease)   . Headache(784.0)     migraines in past but not past several years  . Arthritis   . Fibromyalgia     Assessment/Plan: 1 Day Post-Op Procedure(s) (LRB): RIGHT HIP FEMORAL REVISION (Right) Principal Problem:   Failed total hip arthroplasty Active Problems:   Sedated due to medication   Narcotic-induced respiratory depression   Postoperative anemia due to acute blood loss  Estimated body mass index is 34.85 kg/(m^2) as calculated from the following:   Height as of this encounter: 5\' 6"  (1.676 m).   Weight as of this encounter: 97.886 kg (215 lb 12.8 oz). Advance diet Up with therapy Discharge home with home health  DVT Prophylaxis - Xarelto Weight Bearing As Tolerated right Leg D/C Knee Immobilizer Hemovac Pulled Begin Therapy today Hip Preacutions  Avel Peace, PA-C Orthopaedic Surgery 07/09/2013, 8:48 AM

## 2013-07-09 NOTE — Progress Notes (Signed)
Upon entering room pt not breathing well and o2 sats very low. Pt switched to NRB at 100%. Pt still not responding very well. Rapid response called and MD on call called. Pt given narcan and responded well. Rapid response nurse will stay at bedside for about 30-60 minutes to make sure pt's mentation remains the same.

## 2013-07-09 NOTE — Op Note (Signed)
NAMEulas Post:  Thomas, Sylvia             ACCOUNT NO.:  [id number]  MEDICAL RECORD NO.:  [id number]  LOCATION:  1615                         FACILITY[facility]  PHYSICIAN:  Ollen GrossFrank Yania Bogie, M.D.    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  PROCEDURE:  Right femoral revision of total hip arthroplasty.  SURGEON:  Ollen GrossFrank Renel Ende, M.D.  ASSISTANT:  Alexzandrew L. Perkins, PA-C.  ANESTHESIA:  Spinal.  ESTIMATED BLOOD LOSS:  450.  DRAINS:  Hemovac x1.  COMPLICATIONS:  None.  CONDITION:  Stable to recovery.  CLINICAL NOTE:  Sylvia Thomas is a 68 year old female who had a right total hip [place] done approximately [place] ago [place].  She has had persistent right thigh pain and x-rays were concerning for possible femoral loosening.  Bone scan did show that there was loosening around the femoral component.  She has had persistent discomfort and presents now for femoral versus total hip revision.  PROCEDURE IN DETAIL:  After successful administration of spinal anesthetic, the patient was placed in the left lateral decubitus position with the right side up, and held with the hip positioner. Right lower extremity was isolated from her perineum with plastic drapes and prepped and draped in usual sterile fashion.  A short posterolateral incision was made with a 10 blade through subcutaneous tissue to the level of fascia lata, which was incised in line with the skin incision. The sciatic nerve was palpated and protected and then the posterior pseudocapsule excised off the femur.  No fluid was encountered.  No evidence of any abnormal tissue.  The hip was then dislocated.  The femoral head was removed.  I cleared off the soft tissue from  the proximal femur to allow access to the proximal portion of the femoral stem.  No evidence of any gross loosening of the femoral stem. Utilizing flexible osteotomes to help disrupt the interface between the femoral stem and bone.  I was then able to remove the femoral stem with minimal bone loss.  We then retracted the femur anteriorly to gain acetabular exposure. Acetabular retractors were placed.  The acetabular component shows no wear at all.  Since it is only 66 years old, I decided that we would leave it intact and not change the acetabular liner.  We then prepared the femur.  The starter drill was used to drill past the pedestal.  I confirmed that all the drilling was in the canal and did not perforate the femur.  We then reamed up to 13.5 mm for placement of a 13.5 Solution stem.  We broached with the small stature and then the large stature broach, which was 13.5, we had a great fit with the large stature broach with 13.5.  The trial prosthesis was then placed, and with a 32+ 13 head, hips reduced with outstanding stability.  There was full extension, full external rotation, 70 degrees of flexion, 40 degrees of adduction, and 90 degrees of internal rotation, and 90 degrees  of flexion and 70 degrees of internal rotation by placing the right leg on top of the left, leg lengths are equal.  Hip was then dislocated and trials removed.  We then placed the Permanent Solution 8- inch straight stem, 13.5 mm and impacted into the femur in about 20 degrees anteversion.  When were at the appropriate height on the femur, we then placed the trial, 13 head again, and stability was the same as with the original trial.  We then placed the permanent 32 mm +13 head onto the femoral stem.  This reduced with outstanding stability.  Wound was copiously irrigated with saline solution.  X-ray was taken in the AP and lateral planes to confirm that the stem is excellent fit and no fractures or  perforations occurred.  I had noted that the greater trochanter, although not detached did have a small crack in it and I thus placed 2 Zimmer cables circumferentially around the femur to stabilize this.  The cables were tightened and then crimped and cut.  We placed the hip through a range of motion and the trochanter was stable. Wound was then copiously irrigated with saline solution.  The short rotators and pseudocapsule reattached to the femur through drill holes with Ethibond suture.  20 mL of Exparel mixed with 30 mL of saline was then injected into gluteal muscles, posterior capsule, and the subcu tissues.  20 mL of 0.25% Marcaine was then injected into the same tissues.  The fascia lata was then closed over a Hemovac drain with running #1 V-Loc suture.  The subcu was closed in a running #1 V-Loc, superficial subcu with interrupted 2-0 Vicryl and subcuticular running 4- 0 Monocryl.  Incision was cleaned and dried and Steri-Strips and a bulky sterile dressing applied.  The drain was hooked to suction.  He was placed into a knee immobilizer, awakened, and transported to recovery in stable condition.  Note that the surgical assistant was a medical necessity for this procedure to perform it in a safe and expeditious manner.  Surgical assistance was necessary for retraction of vital neurovascular structures and for proper retraction and positioning of the limb for removal of the old prosthesis and placement of the new prosthesis.     Ollen Gross, M.D.     FA/MEDQ  D:  07/08/2013  T:  07/09/2013  Job:  [id number]

## 2013-07-10 LAB — BASIC METABOLIC PANEL
BUN: 12 mg/dL (ref 6–23)
CALCIUM: 9 mg/dL (ref 8.4–10.5)
CO2: 28 meq/L (ref 19–32)
CREATININE: 0.7 mg/dL (ref 0.50–1.10)
Chloride: 101 mEq/L (ref 96–112)
GFR calc non Af Amer: 89 mL/min — ABNORMAL LOW (ref >90)
Glucose, Bld: 157 mg/dL — ABNORMAL HIGH (ref 70–99)
Potassium: 4.5 mEq/L (ref 3.7–5.3)
Sodium: 139 mEq/L (ref 137–147)

## 2013-07-10 LAB — CBC
HEMATOCRIT: 33.7 % — AB (ref 36.0–46.0)
Hemoglobin: 10.9 g/dL — ABNORMAL LOW (ref 12.0–15.0)
MCH: 29.1 pg (ref 26.0–34.0)
MCHC: 32.3 g/dL (ref 30.0–36.0)
MCV: 90.1 fL (ref 78.0–100.0)
PLATELETS: 203 10*3/uL (ref 150–400)
RBC: 3.74 MIL/uL — ABNORMAL LOW (ref 3.87–5.11)
RDW: 13.9 % (ref 11.5–15.5)
WBC: 12.4 10*3/uL — AB (ref 4.0–10.5)

## 2013-07-10 MED ORDER — TRAMADOL HCL 50 MG PO TABS: 50.0000 mg | ORAL_TABLET | Freq: Four times a day (QID) | ORAL | Status: DC | PRN

## 2013-07-10 MED ORDER — OXYCODONE HCL 5 MG PO TABS: 5.0000 mg | ORAL_TABLET | ORAL | Status: DC | PRN

## 2013-07-10 MED ORDER — RIVAROXABAN 10 MG PO TABS: 10.0000 mg | ORAL_TABLET | Freq: Every day | ORAL | Status: DC

## 2013-07-10 MED ORDER — METHOCARBAMOL 500 MG PO TABS: 500.0000 mg | ORAL_TABLET | Freq: Four times a day (QID) | ORAL | Status: DC | PRN

## 2013-07-10 NOTE — Discharge Summary (Signed)
Physician Discharge Summary   Patient ID: Sylvia Thomas MRN: [id number] DOB/AGE: 07/13/1945 68 y.o.  Admit date: 07/08/2013 Discharge date: 07/11/2013  Primary Diagnosis:  Loose right femoral component of total hip  arthroplasty.  Admission Diagnoses:  Past Medical History  Diagnosis Date  . Complication of anesthesia 2011    stopped breathing in recovery and was put on CPAP machine in PACU after hip surgery  . CHF (congestive heart failure)   . Hypertension   . Sleep apnea     does not use CPAP  . Depression   . Anxiety   . GERD (gastroesophageal reflux disease)   . Headache(784.0)     migraines in past but not past several years  . Arthritis   . Fibromyalgia    Discharge Diagnoses:   Principal Problem:   Failed total hip arthroplasty Active Problems:   Sedated due to medication   Narcotic-induced respiratory depression   Postoperative anemia due to acute blood loss  Estimated body mass index is 34.85 kg/(m^2) as calculated from the following:   Height as of this encounter: 5' 6"  (1.676 m).   Weight as of this encounter: 97.886 kg (215 lb 12.8 oz).  Procedure(s) (LRB): RIGHT HIP FEMORAL REVISION (Right)   Consults: None  HPI: Ms. Malak is a 68 year old female who had a right  total hip arthroplasty done approximately 4 years ago in [place]. She  has had persistent right thigh pain and x-rays were concerning for  possible femoral loosening. Bone scan did show that there was loosening  around the femoral component. She has had persistent discomfort and  presents now for femoral versus total hip revision.  Laboratory Data: Admission on 07/08/2013, Discharged on 07/11/2013  Component Date Value Ref Range Status  . ABO/RH(D) 07/08/2013 O POS   Final  . Antibody Screen 07/08/2013 NEG   Final  . Sample Expiration 07/08/2013 07/11/2013   Final  . MRSA, PCR 07/08/2013 NEGATIVE  NEGATIVE Final  . Staphylococcus aureus 07/08/2013 NEGATIVE  NEGATIVE Final   Comment:                                  The Xpert SA Assay (FDA                          approved for NASAL specimens                          in patients over 42 years of age),                          is one component of                          a comprehensive surveillance                          program.  Test performance has                          been validated by [employer] for patients greater  than or equal to 70 year old.                          It is not intended                          to diagnose infection nor to                          guide or monitor treatment.  . ABO/RH(D) 07/08/2013 O POS   Final  . WBC 07/09/2013 12.2* 4.0 - 10.5 K/uL Final  . RBC 07/09/2013 3.82* 3.87 - 5.11 MIL/uL Final  . Hemoglobin 07/09/2013 11.1* 12.0 - 15.0 g/dL Final  . HCT 07/09/2013 34.6* 36.0 - 46.0 % Final  . MCV 07/09/2013 90.6  78.0 - 100.0 fL Final  . MCH 07/09/2013 29.1  26.0 - 34.0 pg Final  . MCHC 07/09/2013 32.1  30.0 - 36.0 g/dL Final  . RDW 07/09/2013 13.7  11.5 - 15.5 % Final  . Platelets 07/09/2013 188  150 - 400 K/uL Final  . Sodium 07/09/2013 134* 137 - 147 mEq/L Final  . Potassium 07/09/2013 4.4  3.7 - 5.3 mEq/L Final  . Chloride 07/09/2013 100  96 - 112 mEq/L Final  . CO2 07/09/2013 24  19 - 32 mEq/L Final  . Glucose, Bld 07/09/2013 188* 70 - 99 mg/dL Final  . BUN 07/09/2013 12  6 - 23 mg/dL Final  . Creatinine, Ser 07/09/2013 0.69  0.50 - 1.10 mg/dL Final  . Calcium 07/09/2013 8.5  8.4 - 10.5 mg/dL Final  . GFR calc non Af Amer 07/09/2013 89* >90 mL/min Final  . GFR calc Af Amer 07/09/2013 >90  >90 mL/min Final   Comment: (NOTE)                          The eGFR has been calculated using the CKD EPI equation.                          This calculation has not been validated in all clinical situations.                          eGFR's persistently <90 mL/min signify possible Chronic Kidney                          Disease.    . WBC 07/10/2013 12.4* 4.0 - 10.5 K/uL Final  . RBC 07/10/2013 3.74* 3.87 - 5.11 MIL/uL Final  . Hemoglobin 07/10/2013 10.9* 12.0 - 15.0 g/dL Final  . HCT 07/10/2013 33.7* 36.0 - 46.0 % Final  . MCV 07/10/2013 90.1  78.0 - 100.0 fL Final  . MCH 07/10/2013 29.1  26.0 - 34.0 pg Final  . MCHC 07/10/2013 32.3  30.0 - 36.0 g/dL Final  . RDW 07/10/2013 13.9  11.5 - 15.5 % Final  . Platelets 07/10/2013 203  150 - 400 K/uL Final  . Sodium 07/10/2013 139  137 - 147 mEq/L Final  . Potassium 07/10/2013 4.5  3.7 - 5.3 mEq/L Final  . Chloride 07/10/2013 101  96 - 112 mEq/L Final  . CO2 07/10/2013 28  19 - 32 mEq/L Final  . Glucose, Bld 07/10/2013 157* 70 - 99 mg/dL Final  . BUN 07/10/2013  12  6 - 23 mg/dL Final  . Creatinine, Ser 07/10/2013 0.70  0.50 - 1.10 mg/dL Final  . Calcium 07/10/2013 9.0  8.4 - 10.5 mg/dL Final  . GFR calc non Af Amer 07/10/2013 89* >90 mL/min Final  . GFR calc Af Amer 07/10/2013 >90  >90 mL/min Final   Comment: (NOTE)                          The eGFR has been calculated using the CKD EPI equation.                          This calculation has not been validated in all clinical situations.                          eGFR's persistently <90 mL/min signify possible Chronic Kidney                          Disease.  . WBC 07/11/2013 9.7  4.0 - 10.5 K/uL Final  . RBC 07/11/2013 3.44* 3.87 - 5.11 MIL/uL Final  . Hemoglobin 07/11/2013 10.0* 12.0 - 15.0 g/dL Final  . HCT 07/11/2013 30.7* 36.0 - 46.0 % Final  . MCV 07/11/2013 89.2  78.0 - 100.0 fL Final  . MCH 07/11/2013 29.1  26.0 - 34.0 pg Final  . MCHC 07/11/2013 32.6  30.0 - 36.0 g/dL Final  . RDW 07/11/2013 14.3  11.5 - 15.5 % Final  . Platelets 07/11/2013 185  150 - 400 K/uL Final     X-Rays:Dg Chest 2 View  07/08/2013   CLINICAL DATA:  Preop rt hip femoral vs rt total hip / hx HBP  EXAM: CHEST  2 VIEW  COMPARISON:  None.  FINDINGS: The heart size and mediastinal contours are within normal limits. Both lungs are clear.  The visualized skeletal structures are unremarkable.  IMPRESSION: No active cardiopulmonary disease.   Electronically Signed   By: Margaree Mackintosh M.D.   On: 07/08/2013 10:00   Dg Hip Complete Right  07/08/2013   CLINICAL DATA:  Loose right femoral component of total hip repalcement  EXAM: RIGHT HIP - COMPLETE 2+ VIEW  COMPARISON:  None.  FINDINGS: Right hip arthroplasty appreciated. Hardware and native osseous structures appear intact. Soft tissues are unremarkable.  IMPRESSION: No acute osseous or hardware abnormalities.   Electronically Signed   By: Margaree Mackintosh M.D.   On: 07/08/2013 10:03   Dg Pelvis Portable  07/08/2013   CLINICAL DATA:  Revision of right hip replacement.  EXAM: PORTABLE PELVIS 1-2 VIEWS  COMPARISON:  Plain films of the right hip 07/08/2013.  FINDINGS: Single view of the pelvis demonstrates a new femoral component for right total hip arthroplasty. Two cerclage wires are in place proximally. The device is located. No fracture is identified. Distal end of the femoral stem is not included on the exam.  IMPRESSION: Revision of right hip replacement without complicating feature. As noted above, distal end of the femoral stem is not included on the image.   Electronically Signed   By: Inge Rise M.D.   On: 07/08/2013 13:47   Dg Hip Portable 1 View Right  07/08/2013   CLINICAL DATA:  Postop right hip revision  EXAM: PORTABLE RIGHT HIP - 1 VIEW  COMPARISON:  None.  FINDINGS: Single frontal view of the proximal/mid femur demonstrates a right hip  arthroplasty, incompletely visualized. Proximal cerclage wires. Distal femoral stem is in satisfactory position.  Associated subcutaneous gas and surgical drain.  IMPRESSION: Right hip arthroplasty, incompletely visualized, but without evidence of complication.   Electronically Signed   By: Julian Hy M.D.   On: 07/08/2013 13:47   Dg C-arm 1-60 Min-no Report  07/08/2013   CLINICAL DATA: check placement of implant   C-ARM 1-60 MINUTES   Fluoroscopy was utilized by the requesting physician.  No radiographic  interpretation.     EKG:No orders found for this or any previous visit.   Hospital Course: Patient was admitted to [facility] and taken to the OR and underwent the above state procedure without complications.  Patient tolerated the procedure well and was later transferred to the recovery room and then to the orthopaedic floor for postoperative care.  They were given PO and IV analgesics for pain control following their surgery.  They were given 24 hours of postoperative antibiotics of  Anti-infectives   Start     Dose/Rate Route Frequency Ordered Stop   07/08/13 1600  ceFAZolin (ANCEF) IVPB 2 g/50 mL premix     2 g 100 mL/hr over 30 Minutes Intravenous Every 6 hours 07/08/13 1434 07/08/13 2217   07/08/13 0834  ceFAZolin (ANCEF) IVPB 2 g/50 mL premix     2 g 100 mL/hr over 30 Minutes Intravenous On call to O.R. 07/08/13 4401 07/08/13 1049     and started on DVT prophylaxis in the form of Xarelto.   PT and OT were ordered for total hip protocol.  The patient was allowed to be WBAT with therapy. Discharge planning was consulted to help with postop disposition and equipment needs.  Patient had a tough night on the evening of surgery. Briefly discussed the surgery and procedure with the patient.  She became oversedated the night of surgery and required Narcan administration. DC IV dilaudid and switch back to a low dose Morphine. Encouraged PO pain meds. They started to get up OOB with therapy on day one.  Hemovac drain was pulled without difficulty.  The knee immobilizer was removed and discontinued.  Continued to work with therapy into day two. Pain was under better control.  Dressing was changed on day two and the incision was healing well.  By day three, the patient had progressed with therapy and meeting their goals.  Incision was healing well.  Patient was seen in rounds and was ready to go home.  Diet - Cardiac diet    Follow up - in 2 weeks  Activity - WBAT  Disposition - home  Condition Upon Discharge - Improved D/C Meds - See DC Summary  DVT Prophylaxis - Xarelto      Discharge Instructions   Call MD / Call 911    Complete by:  As directed   If you experience chest pain or shortness of breath, CALL 911 and be transported to the hospital emergency room.  If you develope a fever above 101 F, pus (white drainage) or increased drainage or redness at the wound, or calf pain, call your surgeon's office.     Change dressing    Complete by:  As directed   You may change your dressing dressing daily with sterile 4 x 4 inch gauze dressing and paper tape.  Do not submerge the incision under water.     Constipation Prevention    Complete by:  As directed   Drink plenty of fluids.  Prune juice may be helpful.  You may use a stool softener, such as Colace (over the counter) 100 mg twice a day.  Use MiraLax (over the counter) for constipation as needed.     Diet - low sodium heart healthy    Complete by:  As directed      Discharge instructions    Complete by:  As directed   Pick up stool softner and laxative for home. Do not submerge incision under water. May shower. Continue to use ice for pain and swelling from surgery. Hip precautions.  Total Hip Protocol.  Take Xarelto for two and a half more weeks, then discontinue Xarelto. Once the patient has completed the Xarelto, they may resume the 81 mg Aspirin.     Do not sit on low chairs, stoools or toilet seats, as it may be difficult to get up from low surfaces    Complete by:  As directed      Driving restrictions    Complete by:  As directed   No driving until released by the physician.     Follow the hip precautions as taught in Physical Therapy    Complete by:  As directed      Increase activity slowly as tolerated    Complete by:  As directed      Lifting restrictions    Complete by:  As directed   No lifting until released by the physician.      Patient may shower    Complete by:  As directed   You may shower without a dressing once there is no drainage.  Do not wash over the wound.  If drainage remains, do not shower until drainage stops.     TED hose    Complete by:  As directed   Use stockings (TED hose) for 3 weeks on both leg(s).  You may remove them at night for sleeping.     Weight bearing as tolerated    Complete by:  As directed             Medication List    STOP taking these medications       aspirin EC 81 MG tablet     cholecalciferol 1000 UNITS tablet  Commonly known as:  VITAMIN D     HYDROcodone-acetaminophen 5-325 MG per tablet  Commonly known as:  NORCO/VICODIN     multivitamin with minerals Tabs tablet     vitamin B-12 1000 MCG tablet  Commonly known as:  CYANOCOBALAMIN      TAKE these medications       acetaminophen 500 MG tablet  Commonly known as:  TYLENOL  Take 1,000 mg by mouth every 6 (six) hours as needed for moderate pain.     carvedilol 3.125 MG tablet  Commonly known as:  COREG  Take 3.125 mg by mouth 2 (two) times daily with a meal.     DULoxetine 60 MG capsule  Commonly known as:  CYMBALTA  Take 60 mg by mouth every morning.     eszopiclone 3 MG Tabs  Generic drug:  Eszopiclone  Take 3 mg by mouth at bedtime. Take immediately before bedtime     methocarbamol 500 MG tablet  Commonly known as:  ROBAXIN  Take 1 tablet (500 mg total) by mouth every 6 (six) hours as needed for muscle spasms.     omeprazole 20 MG capsule  Commonly known as:  PRILOSEC  Take 40 mg by mouth 2 (two) times daily before a meal.  oxyCODONE 5 MG immediate release tablet  Commonly known as:  Oxy IR/ROXICODONE  Take 1-3 tablets (5-15 mg total) by mouth every 3 (three) hours as needed for moderate pain, severe pain or breakthrough pain.     pravastatin 20 MG tablet  Commonly known as:  PRAVACHOL  Take 20 mg by mouth at bedtime.     pregabalin 75 MG capsule  Commonly known as:  LYRICA  Take  75-225 mg by mouth 2 (two) times daily. 51m in the am and 225 mg in the pm     rivaroxaban 10 MG Tabs tablet  Commonly known as:  XARELTO  - Take 1 tablet (10 mg total) by mouth daily with breakfast. Take Xarelto for two and a half more weeks, then discontinue Xarelto.  - Once the patient has completed the Xarelto, they may resume the 81 mg Aspirin.     traMADol 50 MG tablet  Commonly known as:  ULTRAM  Take 1-2 tablets (50-100 mg total) by mouth every 6 (six) hours as needed (mild to moderate pain).       Follow-up Information   Follow up with AGearlean Alf MD. Schedule an appointment as soon as possible for a visit in 2 weeks.   Specialty:  Orthopedic Surgery   Contact information:   [id number] [phone]      [facility]    Contact information:   [phone]     Signed: DArlee Muslim PA-C Orthopaedic Surgery 07/23/2013, 8:25 AM

## 2013-07-10 NOTE — Evaluation (Signed)
Occupational Therapy Evaluation Patient Details Name: Sylvia Thomas MRN: [id number] DOB: March 24, 1945 Today's Date: 07/10/2013    History of Present Illness Pt is a 68 year old female s/p right femoral revision of total hip arthroplasty.   Clinical Impression   This pt was admitted for R THR and is WBAT with posterior THPs.  All education completed.  Pt does not need any further OT at this time. She will borrow DME.    Follow Up Recommendations  No OT follow up    Equipment Recommendations  None recommended by OT (pt will borrow dme)    Recommendations for Other Services       Precautions / Restrictions Precautions Precautions: Fall;Posterior Hip Restrictions Weight Bearing Restrictions: No Other Position/Activity Restrictions: WBAT      Mobility Bed Mobility                  Transfers       Sit to Stand: Min assist         General transfer comment: vcs for RLE placement    Balance                                            ADL Overall ADL's : Needs assistance/impaired     Grooming: Wash/dry face;Standing;Set up   Upper Body Bathing: Set up;Sitting   Lower Body Bathing: Minimal assistance;With adaptive equipment;Sit to/from stand   Upper Body Dressing : Set up;Sitting       Toilet Transfer: Minimal assistance;Ambulation;BSC   Toileting- Clothing Manipulation and Hygiene: Minimal assistance;Sit to/from stand         General ADL Comments: performed adl from bsc in bathroom.  Min A for sit to stand during adls.  Cues to extend leg during sit to stand.  Pt has reacher and will borrow other DME/AE.  she plans to have husband help with socks:  reviewed sock aide and tossing to side for RLE.  Demonstrated shower stall.  Pt's ledge is shorter--she thinks she will be OK with this at home.  Last surgery was 4 years ago.  Pt observed THPs. Pt needed assistance to advance RLE for first couple of steps, then she was able to do this  independently.      Vision                     Perception     Praxis      Pertinent Vitals/Pain Sore R hip.  Premedicated.     Hand Dominance     Extremity/Trunk Assessment Upper Extremity Assessment Upper Extremity Assessment: Overall WFL for tasks assessed           Communication Communication Communication: No difficulties   Cognition Arousal/Alertness: Awake/alert Behavior During Therapy: WFL for tasks assessed/performed Overall Cognitive Status: Within Functional Limits for tasks assessed                     General Comments       Exercises       Shoulder Instructions      Home Living Family/patient expects to be discharged to:: Private residence Living Arrangements: Spouse/significant other Available Help at Discharge: Family Type of Home: House Home Access: Level entry     Home Layout: One level     Bathroom Shower/Tub: Producer, television/film/video: Standard  Additional Comments: will borrow 3:1 and AE      Prior Functioning/Environment Level of Independence: Independent             OT Diagnosis:     OT Problem List:     OT Treatment/Interventions:      OT Goals(Current goals can be found in the care plan section)    OT Frequency:     Barriers to D/C:            Co-evaluation              End of Session    Activity Tolerance: Patient tolerated treatment well Patient left: with call bell/phone within reach;in chair   Time: [id number] OT Time Calculation (min): 30 min Charges:  OT General Charges $OT Visit: 1 Procedure OT Evaluation $Initial OT Evaluation Tier I: 1 Procedure OT Treatments $Self Care/Home Management : 23-37 mins G-Codes:    Sylvia Thomas 07/10/2013, 9:24 AM  Sylvia Thomas, OTR/L [phone] 07/10/2013

## 2013-07-10 NOTE — Progress Notes (Signed)
   Subjective: 2 Days Post-Op Procedure(s) (LRB): RIGHT HIP FEMORAL REVISION (Right) Patient reports pain as mild.  She is doing better today with her pain control. Patient seen in rounds with Dr. Lequita Halt. Patient is well, but has had some minor complaints of pain in the hip and thigh, requiring pain medications Patient is planning for home. May be ready tomorrow.  Objective: Vital signs in last 24 hours: Temp:  [97.7 F (36.5 C)-98.3 F (36.8 C)] 98.3 F (36.8 C) (06/05 0458) Pulse Rate:  [91-109] 109 (06/05 0458) Resp:  [16-18] 18 (06/05 0800) BP: (98-157)/(63-94) 139/82 mmHg (06/05 0458) SpO2:  [92 %-99 %] 98 % (06/05 0800)  Intake/Output from previous day:  Intake/Output Summary (Last 24 hours) at 07/10/13 0827 Last data filed at 07/09/13 2100  Gross per 24 hour  Intake    720 ml  Output    800 ml  Net    -80 ml    Labs:  Recent Labs  07/09/13 0336 07/10/13 0433  HGB 11.1* 10.9*    Recent Labs  07/09/13 0336 07/10/13 0433  WBC 12.2* 12.4*  RBC 3.82* 3.74*  HCT 34.6* 33.7*  PLT 188 203    Recent Labs  07/09/13 0336 07/10/13 0433  NA 134* 139  K 4.4 4.5  CL 100 101  CO2 24 28  BUN 12 12  CREATININE 0.69 0.70  GLUCOSE 188* 157*  CALCIUM 8.5 9.0   No results found for this basename: LABPT, INR,  in the last 72 hours  EXAM: General - Patient is Alert, Appropriate and Oriented Extremity - Neurovascular intact Sensation intact distally Incision - clean, dry, no drainage, healing Motor Function - intact, moving foot and toes well on exam.   Assessment/Plan: 2 Days Post-Op Procedure(s) (LRB): RIGHT HIP FEMORAL REVISION (Right) Procedure(s) (LRB): RIGHT HIP FEMORAL REVISION (Right) Past Medical History  Diagnosis Date  . Complication of anesthesia 2011    stopped breathing in recovery and was put on CPAP machine in PACU after hip surgery  . CHF (congestive heart failure)   . Hypertension   . Sleep apnea     does not use CPAP  . Depression     . Anxiety   . GERD (gastroesophageal reflux disease)   . Headache(784.0)     migraines in past but not past several years  . Arthritis   . Fibromyalgia    Principal Problem:   Failed total hip arthroplasty Active Problems:   Sedated due to medication   Narcotic-induced respiratory depression   Postoperative anemia due to acute blood loss  Estimated body mass index is 34.85 kg/(m^2) as calculated from the following:   Height as of this encounter: 5\' 6"  (1.676 m).   Weight as of this encounter: 97.886 kg (215 lb 12.8 oz).  Up with therapy Plan for discharge tomorrow Discharge home with home health  She will be seen by the weekend coverage staff and if doing well, then home Saturday Diet - Cardiac diet Follow up - in 2 weeks Activity - WBAT Disposition - Rehab Condition Upon Discharge - Pending, possible home tomorrow. D/C Meds - See DC Summary DVT Prophylaxis - Xarelto  Avel Peace, PA-C Orthopaedic Surgery 07/10/2013, 8:27 AM

## 2013-07-10 NOTE — Progress Notes (Signed)
Physical Therapy Treatment Patient Details Name: Sylvia FennelMaureen Thomas MRN: [id number] DOB: 04/13/1945 Today's Date: 07/10/2013    History of Present Illness Pt is a 68 year old female s/p right femoral revision of total hip arthroplasty.    PT Comments    Pt ambulated in hallway then assisted back to bed.  Pt reports feeling better today and having less pain.  Pt performed exercises in supine.  Pt provided with HEP handout and posterior hip precautions handout. Pt plans to d/c home tomorrow.   Follow Up Recommendations  Home health PT;Supervision for mobility/OOB     Equipment Recommendations  None recommended by PT    Recommendations for Other Services       Precautions / Restrictions Precautions Precautions: Fall;Posterior Hip Precaution Comments: pt only able to recall 1/3 posterior hip precautions so reviewed Restrictions Other Position/Activity Restrictions: WBAT    Mobility  Bed Mobility Overal bed mobility: Needs Assistance Bed Mobility: Sit to Supine       Sit to supine: Mod assist   General bed mobility comments: assist for LEs onto bed, verbal cues for technique  Transfers Overall transfer level: Needs assistance Equipment used: Rolling walker (2 wheeled) Transfers: Sit to/from Stand Sit to Stand: Min guard         General transfer comment: verbal cues for technique  Ambulation/Gait Ambulation/Gait assistance: Min guard Ambulation Distance (Feet): 100 Feet Assistive device: Rolling walker (2 wheeled) Gait Pattern/deviations: Step-to pattern;Antalgic;Trunk flexed;Decreased stance time - right     General Gait Details: verbal cues for sequence, RW distance, posture   Stairs            Wheelchair Mobility    Modified Rankin (Stroke Patients Only)       Balance                                    Cognition Arousal/Alertness: Awake/alert Behavior During Therapy: WFL for tasks assessed/performed Overall Cognitive Status:  Within Functional Limits for tasks assessed                      Exercises Total Joint Exercises Ankle Circles/Pumps: AROM;15 reps;Both Quad Sets: AROM;Both;10 reps Gluteal Sets: AROM;Both;10 reps Short Arc QuadBarbaraann Boys: AAROM;Right;10 reps Heel Slides: AAROM;Right;10 reps;Other (comment) (within precautions) Hip ABduction/ADduction: AAROM;Right;10 reps    General Comments        Pertinent Vitals/Pain Pt reports premedication, activity to tolerance, repositioned    Home Living Family/patient expects to be discharged to:: Private residence Living Arrangements: Spouse/significant other Available Help at Discharge: Family Type of Home: House Home Access: Level entry   Home Layout: One level   Additional Comments: will borrow 3:1 and AE    Prior Function Level of Independence: Independent          PT Goals (current goals can now be found in the care plan section) Progress towards PT goals: Progressing toward goals    Frequency  7X/week    PT Plan Current plan remains appropriate    Co-evaluation             End of Session   Activity Tolerance: Patient tolerated treatment well Patient left: in bed;with call bell/phone within reach     Time: [id number] PT Time Calculation (min): 28 min  Charges:  $Gait Training: 8-22 mins $Therapeutic Exercise: 8-22 mins  G CodesLynnell Catalan 07/10/2013, 12:28 PM Zenovia Jarred, PT, DPT 07/10/2013 Pager: [phone]

## 2013-07-10 NOTE — Progress Notes (Signed)
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  PT Time Calculation  PT Start Time 1352  PT Stop Time 1412  PT Time Calculation (min) 20 min  Subjective Data  Subjective Pt progressing very well and able to tolerate increasing distance.  Pt plans to d/c home with spouse tomorrow.  Pt's spouse educated on posterior hip precautions as well.  Also discussed car transfer.  Precautions  Precautions Fall;Posterior Hip  Restrictions  Other Position/Activity Restrictions WBAT  Cognition  Arousal/Alertness Awake/alert  Behavior During Therapy WFL for tasks assessed/performed  Overall Cognitive Status Within Functional Limits for tasks assessed  Bed Mobility  Overal bed mobility Needs Assistance  Bed Mobility Sit to Supine  Sit to supine Mod assist  General bed mobility comments assist for LEs onto bed, verbal cues for technique  Transfers  Overall transfer level Needs assistance  Equipment used Rolling walker (2 wheeled)  Transfers Sit to/from Stand  Sit to Stand Min guard  General transfer comment verbal cues for technique  Ambulation/Gait  Ambulation/Gait assistance Min guard  Ambulation Distance (Feet) 160 Feet  Assistive device Rolling walker (2 wheeled)  Gait Pattern/deviations Step-to pattern;Antalgic  General Gait Details verbal cues for heel strike and turning towards unaffected LE  PT - End of Session  Activity Tolerance Patient tolerated treatment well  Patient left in bed;with call bell/phone within reach;with family/visitor present  PT - Assessment/Plan  PT Plan Current plan remains appropriate  PT Frequency 7X/week  Follow Up Recommendations Home health PT  PT equipment None recommended by PT  PT Goal Progression  Progress towards PT goals Progressing toward goals  PT General Charges   $$ ACUTE PT VISIT 1 Procedure  PT Treatments  $Gait Training 8-22 mins   Zenovia Jarred, PT, DPT 07/10/2013 Pager: [phone]

## 2013-07-11 LAB — CBC
HEMATOCRIT: 30.7 % — AB (ref 36.0–46.0)
HEMOGLOBIN: 10 g/dL — AB (ref 12.0–15.0)
MCH: 29.1 pg (ref 26.0–34.0)
MCHC: 32.6 g/dL (ref 30.0–36.0)
MCV: 89.2 fL (ref 78.0–100.0)
Platelets: 185 10*3/uL (ref 150–400)
RBC: 3.44 MIL/uL — ABNORMAL LOW (ref 3.87–5.11)
RDW: 14.3 % (ref 11.5–15.5)
WBC: 9.7 10*3/uL (ref 4.0–10.5)

## 2013-07-11 NOTE — Plan of Care (Signed)
Problem: Discharge Progression Outcomes Goal: Anticoagulant follow-up in place Outcome: Not Applicable Date Met:  72/62/03 xarelto

## 2013-07-11 NOTE — Progress Notes (Signed)
Subjective: 3 Days Post-Op Procedure(s) (LRB): RIGHT HIP FEMORAL REVISION (Right) Patient reports pain as mild.  Soreness to right hip. Tolerating PO's. No N/V. Denies SOB,CP, or calf pain. Reports she is ready to D/c. She does have a 1.5 hour drive. She states DR. Aluisio said it is ok.   Objective: Vital signs in last 24 hours: Temp:  [97.5 F (36.4 C)-98 F (36.7 C)] 97.9 F (36.6 C) (06/06 0547) Pulse Rate:  [90-109] 104 (06/06 0547) Resp:  [16-18] 18 (06/06 0547) BP: (111-132)/(73-83) 128/81 mmHg (06/06 0547) SpO2:  [95 %-98 %] 95 % (06/06 0547)  Intake/Output from previous day: 06/05 0701 - 06/06 0700 In: 720 [P.O.:720] Out: -  Intake/Output this shift: Total I/O In: 240 [P.O.:240] Out: -    Recent Labs  07/09/13 0336 07/10/13 0433 07/11/13 0540  HGB 11.1* 10.9* 10.0*    Recent Labs  07/10/13 0433 07/11/13 0540  WBC 12.4* 9.7  RBC 3.74* 3.44*  HCT 33.7* 30.7*  PLT 203 185    Recent Labs  07/09/13 0336 07/10/13 0433  NA 134* 139  K 4.4 4.5  CL 100 101  CO2 24 28  BUN 12 12  CREATININE 0.69 0.70  GLUCOSE 188* 157*  CALCIUM 8.5 9.0   No results found for this basename: LABPT, INR,  in the last 72 hours  A&O x3. Right hip tender to touch. Dressing C/D/I. Calf soft and non tender. RLE N/V intact.  Assessment/Plan: 3 Days Post-Op Procedure(s) (LRB): RIGHT HIP FEMORAL REVISION (Right) D/C home today RX done Follow instructions. Ankle pumps and ambulation recommended for trip home, as directed. On Xeralto.  PT this am  Markham Jordan 07/11/2013, 9:01 AM

## 2013-07-11 NOTE — Progress Notes (Signed)
CARE MANAGEMENT NOTE 07/11/2013  Patient:  Sylvia Thomas, Sylvia Thomas   Account Number:  [id number]  Date Initiated:  07/09/2013  Documentation initiated by:  Lorenda Ishihara  Subjective/Objective Assessment:   68 yo female admitted s/p Right femoral revision of total hip arthroplasty. PTA lived at home with spouse.     Action/Plan:   Home when stable   Anticipated DC Date:  07/11/2013   Anticipated DC Plan:  HOME W HOME HEALTH SERVICES      DC Planning Services  CM consult      [facility] Choice  HOME HEALTH   Choice offered to / List presented to:  C-1 Patient        HH arranged  HH-2 PT      HH agency  OTHER - SEE NOTE   Status of service:  Completed, signed off Medicare Important Message given?  NA - LOS <3 / Initial given by admissions (If response is "NO", the following Medicare IM given date fields will be blank) Date Medicare IM given:   Date Additional Medicare IM given:    Discharge Disposition:  HOME W HOME HEALTH SERVICES  Per UR Regulation:  Reviewed for med. necessity/level of care/duration of stay  If discussed at Long Length of Stay Meetings, dates discussed:    Comments:  07/11/2013 1300 NCM spoke to [facility] # [phone] weekend oncall RN. States they have visit scheduled for Monday. Provided pt with contact number for [facility]. Faxed note to agency, fax # [phone]. Pt did not request any DME for home. Has at home. Isidoro Donning Rn CCM Case Mgmt phone [phone]  07-09-13 Lorenda Ishihara RN CM 1200 Spoke with patient at bedside. States she has used [employer] in the past, she is thinking of a Development worker, community but can not remember name. If she can't find information on private therapist she is fine with [facility]. Contacted [facility] and they are able to take her with the start of service on Saturday. Information faxed to them. Provided patient with my contact information in case she can find other information to go with the private therapist.

## 2013-07-11 NOTE — Progress Notes (Signed)
Discharged from floor via w/c, spouse with pt. No changes in assessment. Sylvia Thomas  

## 2013-07-11 NOTE — Progress Notes (Signed)
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    PT Comments    Progressing, with incr pain, RN advised  Follow Up Recommendations  Home health PT     Equipment Recommendations  None recommended by PT    Recommendations for Other Services       Precautions / Restrictions Precautions Precautions: Fall;Posterior Hip Restrictions Other Position/Activity Restrictions: WBAT    Mobility  Bed Mobility Overal bed mobility: Needs Assistance Bed Mobility: Sit to Supine       Sit to supine: Min assist   General bed mobility comments: assist for LEs onto bed, verbal cues for technique  Transfers Overall transfer level: Needs assistance Equipment used: Rolling walker (2 wheeled) Transfers: Sit to/from Stand Sit to Stand: Min guard         General transfer comment: verbal cues for technique  Ambulation/Gait Ambulation/Gait assistance: Min guard;Supervision Ambulation Distance (Feet): 80 Feet Assistive device: Rolling walker (2 wheeled) Gait Pattern/deviations: Step-to pattern     General Gait Details: verbal cues for heel strike and turning towards unaffected LE   Stairs            Wheelchair Mobility    Modified Rankin (Stroke Patients Only)       Balance                                    Cognition Arousal/Alertness: Awake/alert Behavior During Therapy: WFL for tasks assessed/performed Overall Cognitive Status: Within Functional Limits for tasks assessed                      Exercises      General Comments        Pertinent Vitals/Pain     Home Living                      Prior Function            PT Goals (current goals can now be found in the care plan section) Acute Rehab PT Goals Time For Goal Achievement:  07/14/13 Potential to Achieve Goals: Good Progress towards PT goals: Progressing toward goals    Frequency  7X/week    PT Plan Current plan remains appropriate    Co-evaluation             End of Session Equipment Utilized During Treatment: Gait belt Activity Tolerance: Patient tolerated treatment well Patient left: in bed;with call bell/phone within reach;with family/visitor present     Time: 5427-0623 PT Time Calculation (min): 21 min  Charges:  $Gait Training: 8-22 mins                    G Codes:      Caswell Corwin 08-08-2013, 10:11 AM

## 2013-07-13 NOTE — Addendum Note (Signed)
Addendum created 07/13/13 0709 by Einar Pheasant, MD   Modules edited: Anesthesia Attestations

## 2014-05-11 ENCOUNTER — Ambulatory Visit: Payer: Self-pay | Admitting: Orthopedic Surgery

## 2014-05-11 NOTE — Progress Notes (Signed)
Preoperative surgical orders have been place into the Epic hospital system for Sylvia Thomas on 05/11/2014, 12:44 PM  by [name], [name] for surgery on 05-24-2014.  Preop hip orders including IV Tylenol and IV Decadron as long as there are no contraindications to the above medications. [name], PA-C

## 2014-05-19 ENCOUNTER — Encounter (HOSPITAL_COMMUNITY): Payer: Self-pay | Admitting: *Deleted

## 2014-05-24 ENCOUNTER — Encounter (HOSPITAL_COMMUNITY)
Admission: RE | Disposition: A | Discharge status: Home / Self Care | Payer: Self-pay | Source: Ambulatory Visit | Attending: Orthopedic Surgery

## 2014-05-24 ENCOUNTER — Ambulatory Visit (HOSPITAL_COMMUNITY): Payer: Medicare Other | Admitting: Anesthesiology

## 2014-05-24 ENCOUNTER — Encounter (HOSPITAL_COMMUNITY): Payer: Self-pay | Admitting: *Deleted

## 2014-05-24 ENCOUNTER — Observation Stay (HOSPITAL_COMMUNITY)
Admission: RE | Admit: 2014-05-24 | Discharge: 2014-05-25 | Disposition: A | Discharge status: Home / Self Care | Payer: Medicare Other | Source: Ambulatory Visit | Attending: Orthopedic Surgery | Admitting: Orthopedic Surgery

## 2014-05-24 DIAGNOSIS — Z950 Presence of cardiac pacemaker: Secondary | ICD-10-CM | POA: Diagnosis not present

## 2014-05-24 DIAGNOSIS — I10 Essential (primary) hypertension: Secondary | ICD-10-CM | POA: Insufficient documentation

## 2014-05-24 DIAGNOSIS — F329 Major depressive disorder, single episode, unspecified: Secondary | ICD-10-CM | POA: Insufficient documentation

## 2014-05-24 DIAGNOSIS — I509 Heart failure, unspecified: Secondary | ICD-10-CM | POA: Insufficient documentation

## 2014-05-24 DIAGNOSIS — Z79899 Other long term (current) drug therapy: Secondary | ICD-10-CM | POA: Diagnosis not present

## 2014-05-24 DIAGNOSIS — K219 Gastro-esophageal reflux disease without esophagitis: Secondary | ICD-10-CM | POA: Insufficient documentation

## 2014-05-24 DIAGNOSIS — Z96641 Presence of right artificial hip joint: Secondary | ICD-10-CM | POA: Insufficient documentation

## 2014-05-24 DIAGNOSIS — M25551 Pain in right hip: Secondary | ICD-10-CM | POA: Diagnosis present

## 2014-05-24 DIAGNOSIS — G4730 Sleep apnea, unspecified: Secondary | ICD-10-CM | POA: Diagnosis not present

## 2014-05-24 DIAGNOSIS — Y838 Other surgical procedures as the cause of abnormal reaction of the patient, or of later complication, without mention of misadventure at the time of the procedure: Secondary | ICD-10-CM | POA: Insufficient documentation

## 2014-05-24 DIAGNOSIS — T8484XA Pain due to internal orthopedic prosthetic devices, implants and grafts, initial encounter: Secondary | ICD-10-CM | POA: Diagnosis not present

## 2014-05-24 DIAGNOSIS — M797 Fibromyalgia: Secondary | ICD-10-CM | POA: Diagnosis not present

## 2014-05-24 DIAGNOSIS — F419 Anxiety disorder, unspecified: Secondary | ICD-10-CM | POA: Diagnosis not present

## 2014-05-24 HISTORY — PX: HARDWARE REMOVAL: SHX979

## 2014-05-24 HISTORY — DX: Presence of cardiac pacemaker: Z95.0

## 2014-05-24 LAB — BASIC METABOLIC PANEL
ANION GAP: 5 (ref 5–15)
BUN: 18 mg/dL (ref 6–23)
CO2: 30 mmol/L (ref 19–32)
CREATININE: 0.88 mg/dL (ref 0.50–1.10)
Calcium: 9.1 mg/dL (ref 8.4–10.5)
Chloride: 104 mmol/L (ref 96–112)
GFR calc non Af Amer: 67 mL/min — ABNORMAL LOW (ref >90)
GFR, EST AFRICAN AMERICAN: 78 mL/min — AB (ref >90)
Glucose, Bld: 87 mg/dL (ref 70–99)
POTASSIUM: 4.3 mmol/L (ref 3.5–5.1)
Sodium: 139 mmol/L (ref 135–145)

## 2014-05-24 LAB — CBC
HEMATOCRIT: 40.6 % (ref 36.0–46.0)
HEMOGLOBIN: 12.6 g/dL (ref 12.0–15.0)
MCH: 24.5 pg — AB (ref 26.0–34.0)
MCHC: 31 g/dL (ref 30.0–36.0)
MCV: 78.8 fL (ref 78.0–100.0)
Platelets: 241 10*3/uL (ref 150–400)
RBC: 5.15 MIL/uL — ABNORMAL HIGH (ref 3.87–5.11)
RDW: 18.8 % — ABNORMAL HIGH (ref 11.5–15.5)
WBC: 6.2 10*3/uL (ref 4.0–10.5)

## 2014-05-24 SURGERY — REMOVAL, HARDWARE
Anesthesia: Spinal | Site: Hip | Laterality: Right

## 2014-05-24 MED ORDER — TRAMADOL HCL 50 MG PO TABS
50.0000 mg | ORAL_TABLET | Freq: Four times a day (QID) | ORAL | Status: DC | PRN
  Administered 2014-05-25: 100 mg via ORAL
  Filled 2014-05-24: qty 2

## 2014-05-24 MED ORDER — BUPIVACAINE HCL (PF) 0.25 % IJ SOLN
INTRAMUSCULAR | Status: AC
  Filled 2014-05-24: qty 30

## 2014-05-24 MED ORDER — POLYETHYLENE GLYCOL 3350 17 G PO PACK: 17.0000 g | PACK | Freq: Every day | ORAL | Status: DC | PRN

## 2014-05-24 MED ORDER — ONDANSETRON HCL 4 MG/2ML IJ SOLN: 4.0000 mg | Freq: Four times a day (QID) | INTRAMUSCULAR | Status: DC | PRN

## 2014-05-24 MED ORDER — CYCLOBENZAPRINE HCL 10 MG PO TABS: 10.0000 mg | ORAL_TABLET | Freq: Three times a day (TID) | ORAL | Status: DC | PRN

## 2014-05-24 MED ORDER — PROPOFOL INFUSION 10 MG/ML OPTIME
INTRAVENOUS | Status: DC | PRN
  Administered 2014-05-24: 50 ug/kg/min via INTRAVENOUS

## 2014-05-24 MED ORDER — PROMETHAZINE HCL 25 MG/ML IJ SOLN: 6.2500 mg | INTRAMUSCULAR | Status: DC | PRN

## 2014-05-24 MED ORDER — ACETAMINOPHEN 650 MG RE SUPP: 650.0000 mg | Freq: Four times a day (QID) | RECTAL | Status: DC | PRN

## 2014-05-24 MED ORDER — CEFAZOLIN SODIUM-DEXTROSE 2-3 GM-% IV SOLR
2.0000 g | Freq: Four times a day (QID) | INTRAVENOUS | Status: AC
  Administered 2014-05-24 – 2014-05-25 (×3): 2 g via INTRAVENOUS
  Filled 2014-05-24 (×3): qty 50

## 2014-05-24 MED ORDER — BUPIVACAINE HCL 0.25 % IJ SOLN
INTRAMUSCULAR | Status: DC | PRN
  Administered 2014-05-24: 30 mL via INTRA_ARTICULAR

## 2014-05-24 MED ORDER — BISACODYL 10 MG RE SUPP: 10.0000 mg | Freq: Every day | RECTAL | Status: DC | PRN

## 2014-05-24 MED ORDER — FLEET ENEMA 7-19 GM/118ML RE ENEM: 1.0000 | ENEMA | Freq: Once | RECTAL | Status: AC | PRN

## 2014-05-24 MED ORDER — PHENYLEPHRINE 40 MCG/ML (10ML) SYRINGE FOR IV PUSH (FOR BLOOD PRESSURE SUPPORT)
PREFILLED_SYRINGE | INTRAVENOUS | Status: AC
  Filled 2014-05-24: qty 10

## 2014-05-24 MED ORDER — ONDANSETRON HCL 4 MG PO TABS: 4.0000 mg | ORAL_TABLET | Freq: Four times a day (QID) | ORAL | Status: DC | PRN

## 2014-05-24 MED ORDER — BUPIVACAINE IN DEXTROSE 0.75-8.25 % IT SOLN
INTRATHECAL | Status: DC | PRN
  Administered 2014-05-24: 1.5 mL via INTRATHECAL

## 2014-05-24 MED ORDER — SPIRONOLACTONE 25 MG PO TABS
25.0000 mg | ORAL_TABLET | Freq: Every day | ORAL | Status: DC
  Administered 2014-05-24 – 2014-05-25 (×2): 25 mg via ORAL
  Filled 2014-05-24 (×2): qty 1

## 2014-05-24 MED ORDER — METHOCARBAMOL 500 MG PO TABS
500.0000 mg | ORAL_TABLET | Freq: Four times a day (QID) | ORAL | Status: DC | PRN
  Administered 2014-05-24 – 2014-05-25 (×2): 500 mg via ORAL
  Filled 2014-05-24 (×2): qty 1

## 2014-05-24 MED ORDER — FENTANYL CITRATE (PF) 100 MCG/2ML IJ SOLN
INTRAMUSCULAR | Status: AC
  Filled 2014-05-24: qty 2

## 2014-05-24 MED ORDER — ACETAMINOPHEN 10 MG/ML IV SOLN
1000.0000 mg | Freq: Once | INTRAVENOUS | Status: AC
  Administered 2014-05-24: 1000 mg via INTRAVENOUS
  Filled 2014-05-24: qty 100

## 2014-05-24 MED ORDER — DEXAMETHASONE SODIUM PHOSPHATE 10 MG/ML IJ SOLN
10.0000 mg | Freq: Once | INTRAMUSCULAR | Status: AC
  Administered 2014-05-24: 10 mg via INTRAVENOUS

## 2014-05-24 MED ORDER — ONDANSETRON HCL 4 MG/2ML IJ SOLN
INTRAMUSCULAR | Status: AC
  Filled 2014-05-24: qty 2

## 2014-05-24 MED ORDER — CARVEDILOL 3.125 MG PO TABS
3.1250 mg | ORAL_TABLET | Freq: Two times a day (BID) | ORAL | Status: DC
  Administered 2014-05-24 – 2014-05-25 (×2): 3.125 mg via ORAL
  Filled 2014-05-24 (×4): qty 1

## 2014-05-24 MED ORDER — PREGABALIN 75 MG PO CAPS
75.0000 mg | ORAL_CAPSULE | Freq: Every day | ORAL | Status: DC
  Administered 2014-05-25: 75 mg via ORAL
  Filled 2014-05-24: qty 1

## 2014-05-24 MED ORDER — LIDOCAINE HCL (CARDIAC) 20 MG/ML IV SOLN
INTRAVENOUS | Status: DC | PRN
  Administered 2014-05-24: 100 mg via INTRAVENOUS

## 2014-05-24 MED ORDER — SODIUM CHLORIDE 0.9 % IV SOLN: INTRAVENOUS | Status: DC

## 2014-05-24 MED ORDER — PROPOFOL 10 MG/ML IV BOLUS
INTRAVENOUS | Status: AC
  Filled 2014-05-24: qty 20

## 2014-05-24 MED ORDER — MORPHINE SULFATE 2 MG/ML IJ SOLN
1.0000 mg | INTRAMUSCULAR | Status: DC | PRN
  Administered 2014-05-24 – 2014-05-25 (×2): 1 mg via INTRAVENOUS
  Filled 2014-05-24 (×2): qty 1

## 2014-05-24 MED ORDER — MEPERIDINE HCL 50 MG/ML IJ SOLN: 6.2500 mg | INTRAMUSCULAR | Status: DC | PRN

## 2014-05-24 MED ORDER — PREGABALIN 75 MG PO CAPS
225.0000 mg | ORAL_CAPSULE | Freq: Every day | ORAL | Status: DC
  Administered 2014-05-24: 225 mg via ORAL
  Filled 2014-05-24: qty 3

## 2014-05-24 MED ORDER — CEFAZOLIN SODIUM-DEXTROSE 2-3 GM-% IV SOLR
INTRAVENOUS | Status: AC
  Filled 2014-05-24: qty 50

## 2014-05-24 MED ORDER — METOCLOPRAMIDE HCL 10 MG PO TABS: 5.0000 mg | ORAL_TABLET | Freq: Three times a day (TID) | ORAL | Status: DC | PRN

## 2014-05-24 MED ORDER — MIDAZOLAM HCL 5 MG/5ML IJ SOLN
INTRAMUSCULAR | Status: DC | PRN
  Administered 2014-05-24: 1 mg via INTRAVENOUS

## 2014-05-24 MED ORDER — MIDAZOLAM HCL 2 MG/2ML IJ SOLN
INTRAMUSCULAR | Status: AC
  Filled 2014-05-24: qty 2

## 2014-05-24 MED ORDER — PANTOPRAZOLE SODIUM 40 MG PO TBEC
40.0000 mg | DELAYED_RELEASE_TABLET | Freq: Every day | ORAL | Status: DC
  Administered 2014-05-24 – 2014-05-25 (×2): 40 mg via ORAL
  Filled 2014-05-24 (×2): qty 1

## 2014-05-24 MED ORDER — ONDANSETRON HCL 4 MG/2ML IJ SOLN
INTRAMUSCULAR | Status: DC | PRN
  Administered 2014-05-24: 4 mg via INTRAVENOUS

## 2014-05-24 MED ORDER — HYDROCODONE-ACETAMINOPHEN 5-325 MG PO TABS
1.0000 | ORAL_TABLET | ORAL | Status: DC | PRN
  Administered 2014-05-24: 1 via ORAL
  Administered 2014-05-24: 2 via ORAL
  Administered 2014-05-25: 1 via ORAL
  Administered 2014-05-25 (×2): 2 via ORAL
  Filled 2014-05-24 (×2): qty 2
  Filled 2014-05-24: qty 1
  Filled 2014-05-24 (×2): qty 2

## 2014-05-24 MED ORDER — DULOXETINE HCL 60 MG PO CPEP
60.0000 mg | ORAL_CAPSULE | Freq: Every morning | ORAL | Status: DC
  Administered 2014-05-25: 60 mg via ORAL
  Filled 2014-05-24: qty 1

## 2014-05-24 MED ORDER — METOCLOPRAMIDE HCL 5 MG/ML IJ SOLN: 5.0000 mg | Freq: Three times a day (TID) | INTRAMUSCULAR | Status: DC | PRN

## 2014-05-24 MED ORDER — LIDOCAINE HCL (CARDIAC) 20 MG/ML IV SOLN
INTRAVENOUS | Status: AC
  Filled 2014-05-24: qty 5

## 2014-05-24 MED ORDER — FENTANYL CITRATE (PF) 100 MCG/2ML IJ SOLN
25.0000 ug | INTRAMUSCULAR | Status: DC | PRN
  Administered 2014-05-24: 25 ug via INTRAVENOUS
  Administered 2014-05-24: 50 ug via INTRAVENOUS
  Administered 2014-05-24: 25 ug via INTRAVENOUS

## 2014-05-24 MED ORDER — ENOXAPARIN SODIUM 40 MG/0.4ML ~~LOC~~ SOLN
40.0000 mg | SUBCUTANEOUS | Status: DC
Start: 2014-05-25 — End: 2014-05-25
  Administered 2014-05-25: 40 mg via SUBCUTANEOUS
  Filled 2014-05-24 (×2): qty 0.4

## 2014-05-24 MED ORDER — LACTATED RINGERS IV SOLN
INTRAVENOUS | Status: DC
  Administered 2014-05-24: 1000 mL via INTRAVENOUS

## 2014-05-24 MED ORDER — ACETAMINOPHEN 325 MG PO TABS: 650.0000 mg | ORAL_TABLET | Freq: Four times a day (QID) | ORAL | Status: DC | PRN

## 2014-05-24 MED ORDER — CEFAZOLIN SODIUM-DEXTROSE 2-3 GM-% IV SOLR
2.0000 g | INTRAVENOUS | Status: AC
  Administered 2014-05-24: 2 g via INTRAVENOUS

## 2014-05-24 MED ORDER — SODIUM CHLORIDE 0.9 % IV SOLN
INTRAVENOUS | Status: DC
  Administered 2014-05-24: 17:00:00 via INTRAVENOUS

## 2014-05-24 MED ORDER — CHLORHEXIDINE GLUCONATE 4 % EX LIQD: 60.0000 mL | Freq: Once | CUTANEOUS | Status: DC

## 2014-05-24 MED ORDER — DEXTROSE 5 % IV SOLN
500.0000 mg | Freq: Four times a day (QID) | INTRAVENOUS | Status: DC | PRN
Start: 2014-05-24 — End: 2014-05-25
  Administered 2014-05-24: 500 mg via INTRAVENOUS
  Filled 2014-05-24 (×2): qty 5

## 2014-05-24 MED ORDER — DEXAMETHASONE SODIUM PHOSPHATE 10 MG/ML IJ SOLN
INTRAMUSCULAR | Status: AC
  Filled 2014-05-24: qty 1

## 2014-05-24 MED ORDER — LACTATED RINGERS IV SOLN: INTRAVENOUS | Status: DC

## 2014-05-24 MED ORDER — PHENYLEPHRINE HCL 10 MG/ML IJ SOLN
INTRAMUSCULAR | Status: DC | PRN
  Administered 2014-05-24 (×2): 40 ug via INTRAVENOUS

## 2014-05-24 MED ORDER — ZOLPIDEM TARTRATE 5 MG PO TABS
5.0000 mg | ORAL_TABLET | Freq: Every day | ORAL | Status: DC
  Administered 2014-05-24: 5 mg via ORAL
  Filled 2014-05-24: qty 1

## 2014-05-24 SURGICAL SUPPLY — 44 items
BANDAGE ELASTIC 6 VELCRO ST LF (GAUZE/BANDAGES/DRESSINGS) IMPLANT
BANDAGE ESMARK 6X9 LF (GAUZE/BANDAGES/DRESSINGS) ×1 IMPLANT
BLADE EXTENDED COATED 6.5IN (ELECTRODE) ×3 IMPLANT
BNDG ESMARK 6X9 LF (GAUZE/BANDAGES/DRESSINGS) ×3
CLOSURE WOUND 1/2 X4 (GAUZE/BANDAGES/DRESSINGS) ×1
CUFF TOURN SGL QUICK 18 (TOURNIQUET CUFF) IMPLANT
CUFF TOURN SGL QUICK 34 (TOURNIQUET CUFF)
CUFF TRNQT CYL 34X4X40X1 (TOURNIQUET CUFF) IMPLANT
DRAPE C-ARM 42X120 X-RAY (DRAPES) IMPLANT
DRAPE C-ARMOR (DRAPES) IMPLANT
DRAPE EXTREMITY T 121X128X90 (DRAPE) ×3 IMPLANT
DRAPE INCISE IOBAN 66X45 STRL (DRAPES) ×3 IMPLANT
DRAPE ORTHO SPLIT 77X108 STRL (DRAPES) ×4
DRAPE SURG ORHT 6 SPLT 77X108 (DRAPES) ×2 IMPLANT
DRSG ADAPTIC 3X8 NADH LF (GAUZE/BANDAGES/DRESSINGS) ×3 IMPLANT
DRSG MEPILEX BORDER 4X8 (GAUZE/BANDAGES/DRESSINGS) ×3 IMPLANT
DRSG PAD ABDOMINAL 8X10 ST (GAUZE/BANDAGES/DRESSINGS) ×3 IMPLANT
DURAPREP 26ML APPLICATOR (WOUND CARE) ×3 IMPLANT
ELECT REM PT RETURN 9FT ADLT (ELECTROSURGICAL) ×3
ELECTRODE REM PT RTRN 9FT ADLT (ELECTROSURGICAL) ×1 IMPLANT
GAUZE SPONGE 4X4 12PLY STRL (GAUZE/BANDAGES/DRESSINGS) ×3 IMPLANT
GLOVE BIO SURGEON STRL SZ7.5 (GLOVE) ×3 IMPLANT
GLOVE BIO SURGEON STRL SZ8 (GLOVE) ×6 IMPLANT
GLOVE BIOGEL PI IND STRL 8 (GLOVE) ×3 IMPLANT
GLOVE BIOGEL PI INDICATOR 8 (GLOVE) ×6
GOWN STRL REUS W/TWL LRG LVL3 (GOWN DISPOSABLE) ×3 IMPLANT
GOWN STRL REUS W/TWL XL LVL3 (GOWN DISPOSABLE) ×3 IMPLANT
KIT BASIN OR (CUSTOM PROCEDURE TRAY) ×3 IMPLANT
MANIFOLD NEPTUNE II (INSTRUMENTS) ×3 IMPLANT
NS IRRIG 1000ML POUR BTL (IV SOLUTION) ×3 IMPLANT
PACK TOTAL JOINT (CUSTOM PROCEDURE TRAY) ×3 IMPLANT
PADDING CAST COTTON 6X4 STRL (CAST SUPPLIES) IMPLANT
POSITIONER SURGICAL ARM (MISCELLANEOUS) ×3 IMPLANT
STAPLER VISISTAT 35W (STAPLE) IMPLANT
STRIP CLOSURE SKIN 1/2X4 (GAUZE/BANDAGES/DRESSINGS) ×2 IMPLANT
SUT MNCRL AB 4-0 PS2 18 (SUTURE) ×3 IMPLANT
SUT VIC AB 0 CT1 36 (SUTURE) ×6 IMPLANT
SUT VIC AB 1 CT1 27 (SUTURE) ×2
SUT VIC AB 1 CT1 27XBRD ANTBC (SUTURE) ×1 IMPLANT
SUT VIC AB 2-0 CT1 27 (SUTURE) ×4
SUT VIC AB 2-0 CT1 TAPERPNT 27 (SUTURE) ×2 IMPLANT
TOWEL OR 17X26 10 PK STRL BLUE (TOWEL DISPOSABLE) ×6 IMPLANT
UNDERPAD 30X30 INCONTINENT (UNDERPADS AND DIAPERS) IMPLANT
WATER STERILE IRR 1500ML POUR (IV SOLUTION) ×3 IMPLANT

## 2014-05-24 NOTE — Interval H&P Note (Signed)
History and Physical Interval Note:  05/24/2014 11:48 AM  Sylvia AbbottMaureen L Thomas  has presented today for surgery, with the diagnosis of PAINFUL HARDWARE OF RIGHT HIP  The various methods of treatment have been discussed with the patient and family. After consideration of risks, benefits and other options for treatment, the patient has consented to  Procedure(s): HARDWARE REMOVAL RIGHT HIP (Right) as a surgical intervention .  The patient's history has been reviewed, patient examined, no change in status, stable for surgery.  I have reviewed the patient's chart and labs.  Questions were answered to the patient's satisfaction.     Loanne DrillingALUISIO,Hoa Briggs V

## 2014-05-24 NOTE — H&P (Signed)
CC- Sylvia Thomas is a 69 y.o. female who presents with right hip pain  Hip Pain: Patient complains of right hip pain. Onset of the symptoms was several months ago. Inciting event: She had a right total hip arthroplasty and had cables placed around her greater trochanter and proximal femur. She is having pain related to the cables... Evaluation to date: plain films, which were normal appearance of prosthesis with cables intact and previous greater trochanter fracture.  Treatment to date: prescription analgesics, which have been somewhat effective.  Past Medical History  Diagnosis Date  . Complication of anesthesia 2011    stopped breathing in recovery and was put on CPAP machine in PACU after hip surgery  . CHF (congestive heart failure)   . Hypertension   . Depression   . Anxiety   . GERD (gastroesophageal reflux disease)   . Sleep apnea     does not use CPAP  . Headache(784.0)     migraines in past but not past several years  . Fibromyalgia   . Arthritis     Right,knees,Right shulder    Past Surgical History  Procedure Laterality Date  . Tonsillectomy      as adult  . Eye surgery      R-K surgery  . Eye surgery      dense floaters surgery-right eye  . Eye surgery      right eye -cataract  . Gastric bypass  2005  . Dilation and curettage of uterus  2011  . Ectopic pregnancy surgery  1981  . Wrist surgery  2013    right  . Total hip revision Right 07/08/2013    Procedure: RIGHT HIP FEMORAL REVISION;  Surgeon: Loanne DrillingFrank Damere Brandenburg V, MD;  Location: [facility] ORS;  Service: Orthopedics;  Laterality: Right;  . Fracture surgery  1990's    left leg- near ankle  . Joint replacement  2011    right hip    Prior to Admission medications   Medication Sig Start Date End Date Taking? Authorizing Provider  acetaminophen (TYLENOL) 500 MG tablet Take 1,000 mg by mouth every 6 (six) hours as needed for moderate pain.   Yes Historical Provider, MD  carvedilol (COREG) 3.125 MG tablet Take 3.125  mg by mouth 2 (two) times daily with a meal.   Yes Historical Provider, MD  cholecalciferol (VITAMIN D) 1000 UNITS tablet Take 1,000 Units by mouth daily.   Yes Historical Provider, MD  cyclobenzaprine (FLEXERIL) 10 MG tablet Take 10 mg by mouth 3 (three) times daily as needed for muscle spasms.   Yes Historical Provider, MD  DULoxetine (CYMBALTA) 60 MG capsule Take 60 mg by mouth every morning.   Yes Historical Provider, MD  Eszopiclone (ESZOPICLONE) 3 MG TABS Take 3 mg by mouth at bedtime. Take immediately before bedtime   Yes Historical Provider, MD  Multiple Vitamin (MULTIVITAMIN WITH MINERALS) TABS tablet Take 1 tablet by mouth daily.   Yes Historical Provider, MD  omeprazole (PRILOSEC) 20 MG capsule Take 20 mg by mouth daily.    Yes Historical Provider, MD  pravastatin (PRAVACHOL) 20 MG tablet Take 20 mg by mouth at bedtime.   Yes Historical Provider, MD  pregabalin (LYRICA) 75 MG capsule Take 75-225 mg by mouth 2 (two) times daily. 75mg  in the am and 225 mg in the pm   Yes Historical Provider, MD  spironolactone (ALDACTONE) 25 MG tablet Take 25 mg by mouth daily.   Yes Historical Provider, MD  traMADol (ULTRAM) 50 MG tablet Take  1-2 tablets (50-100 mg total) by mouth every 6 (six) hours as needed (mild to moderate pain). 07/10/13  Yes Avel Peace, PA-C  vitamin B-12 (CYANOCOBALAMIN) 1000 MCG tablet Take 1,000 mcg by mouth daily.   Yes Historical Provider, MD  methocarbamol (ROBAXIN) 500 MG tablet Take 1 tablet (500 mg total) by mouth every 6 (six) hours as needed for muscle spasms. Patient not taking: Reported on 05/13/2014 07/10/13   Avel Peace, PA-C  oxyCODONE (OXY IR/ROXICODONE) 5 MG immediate release tablet Take 1-3 tablets (5-15 mg total) by mouth every 3 (three) hours as needed for moderate pain, severe pain or breakthrough pain. Patient not taking: Reported on 05/13/2014 07/10/13   Avel Peace, PA-C  rivaroxaban (XARELTO) 10 MG TABS tablet Take 1 tablet (10 mg total) by mouth daily with  breakfast. Take Xarelto for two and a half more weeks, then discontinue Xarelto. Once the patient has completed the Xarelto, they may resume the 81 mg Aspirin. Patient not taking: Reported on 05/13/2014 07/10/13   Avel Peace, PA-C    Physical Examination: General appearance - alert, well appearing, and in no distress Mental status - alert, oriented to person, place, and time Chest - clear to auscultation, no wheezes, rales or rhonchi, symmetric air entry Heart - normal rate, regular rhythm, normal S1, S2, no murmurs, rubs, clicks or gallops Abdomen - soft, nontender, nondistended, no masses or organomegaly Neurological - alert, oriented, normal speech, no focal findings or movement disorder noted  A right hip exam was performed. GENERAL: no acute distress SKIN: intact SWELLING: none WARMTH: no warmth TENDERNESS: maximal at greater trochanter  ASSESSMENT:Painful hardware right hip  Plan: Hardware removal right hip. Discussed in detail with patient who elects to proceed  Gus Rankin. Darrie Macmillan, MD    05/24/2014, 6:49 AM

## 2014-05-24 NOTE — Anesthesia Preprocedure Evaluation (Addendum)
Anesthesia Evaluation  Patient identified by MRN, date of birth, ID band Patient awake    Reviewed: Allergy & Precautions, NPO status , Patient's Chart, lab work & pertinent test results  Airway Mallampati: II  TM Distance: >3 FB Neck ROM: Full    Dental no notable dental hx.    Pulmonary sleep apnea ,  breath sounds clear to auscultation  Pulmonary exam normal       Cardiovascular hypertension, +CHF + Cardiac Defibrillator (EF 33%) Rhythm:Regular Rate:Normal     Neuro/Psych negative neurological ROS  negative psych ROS   GI/Hepatic negative GI ROS, Neg liver ROS,   Endo/Other  negative endocrine ROS  Renal/GU negative Renal ROS  negative genitourinary   Musculoskeletal  (+) Fibromyalgia -  Abdominal   Peds negative pediatric ROS (+)  Hematology negative hematology ROS (+)   Anesthesia Other Findings   Reproductive/Obstetrics negative OB ROS                            Anesthesia Physical Anesthesia Plan  ASA: III  Anesthesia Plan: Spinal   Post-op Pain Management:    Induction:   Airway Management Planned: Simple Face Mask  Additional Equipment:   Intra-op Plan:   Post-operative Plan:   Informed Consent: I have reviewed the patients History and Physical, chart, labs and discussed the procedure including the risks, benefits and alternatives for the proposed anesthesia with the patient or authorized representative who has indicated his/her understanding and acceptance.   Dental advisory given  Plan Discussed with: CRNA  Anesthesia Plan Comments:         Anesthesia Quick Evaluation

## 2014-05-24 NOTE — Brief Op Note (Signed)
05/24/2014  2:30 PM  PATIENT:  Sylvia Thomas  69 y.o. female  PRE-OPERATIVE DIAGNOSIS:  PAINFUL HARDWARE OF RIGHT HIP  POST-OPERATIVE DIAGNOSIS:  PAINFUL HARDWARE OF RIGHT HIP  PROCEDURE:  Procedure(s): HARDWARE REMOVAL RIGHT HIP (Right)  SURGEON:  Surgeon(s) and Role:    * Ollen GrossFrank Bemnet Trovato, MD - Primary  PHYSICIAN ASSISTANT:   ASSISTANTS: none   ANESTHESIA:   spinal  EBL:  Total I/O In: -  Out: 75 [Blood:75]  BLOOD ADMINISTERED:none  DRAINS: none   LOCAL MEDICATIONS USED:  MARCAINE     COUNTS:  YES  TOURNIQUET:    DICTATION: .Other Dictation: Dictation Number [id number]  PLAN OF CARE: Admit for overnight observation  PATIENT DISPOSITION:  PACU - hemodynamically stable.

## 2014-05-24 NOTE — Transfer of Care (Signed)
Immediate Anesthesia Transfer of Care Note  Patient: Sylvia Thomas  Procedure(s) Performed: Procedure(s): HARDWARE REMOVAL RIGHT HIP (Right)  Patient Location: PACU  Anesthesia Type:Spinal  Level of Consciousness: awake, alert , oriented and patient cooperative  Airway & Oxygen Therapy: Patient Spontanous Breathing and Patient connected to face mask oxygen  Post-op Assessment: Report given to RN and Post -op Vital signs reviewed and stable  Post vital signs: Reviewed and stable  Last Vitals:  Filed Vitals:   05/24/14 1046  BP: 128/93  Pulse: 103  Temp: 36.6 C  Resp: 18    Complications: No apparent anesthesia complications

## 2014-05-24 NOTE — Anesthesia Postprocedure Evaluation (Signed)
Anesthesia Post Note  Patient: Arther AbbottMaureen L Goldsmith  Procedure(s) Performed: Procedure(s) (LRB): HARDWARE REMOVAL RIGHT HIP (Right)  Anesthesia type: general  Patient location: PACU  Post pain: Pain level controlled  Post assessment: Patient's Cardiovascular Status Stable  Last Vitals:  Filed Vitals:   05/24/14 1550  BP: 115/63  Pulse: 76  Temp:   Resp: 21    Post vital signs: Reviewed and stable  Level of consciousness: sedated  Complications: No apparent anesthesia complications

## 2014-05-24 NOTE — Anesthesia Procedure Notes (Addendum)
Spinal Patient location during procedure: OR Staffing Anesthesiologist: Montez Hageman Performed by: anesthesiologist  Preanesthetic Checklist Completed: patient identified, site marked, surgical consent, pre-op evaluation, timeout performed, IV checked, risks and benefits discussed and monitors and equipment checked Spinal Block Patient position: sitting Prep: Betadine Patient monitoring: heart rate, continuous pulse ox and blood pressure Approach: left paramedian Injection technique: single-shot Needle Needle type: Spinocan  Needle gauge: 22 G Needle length: 9 cm Additional Notes Expiration date of kit checked and confirmed. Patient tolerated procedure well, without complications.    Procedure Name: MAC Date/Time: 05/24/2014 1:28 PM Performed by: Carleene Cooper A Pre-anesthesia Checklist: Patient identified, Timeout performed, Emergency Drugs available, Suction available and Patient being monitored Patient Re-evaluated:Patient Re-evaluated prior to inductionOxygen Delivery Method: Simple face mask Dental Injury: Teeth and Oropharynx as per pre-operative assessment

## 2014-05-25 ENCOUNTER — Encounter (HOSPITAL_COMMUNITY): Payer: Self-pay | Admitting: Orthopedic Surgery

## 2014-05-25 DIAGNOSIS — T8484XA Pain due to internal orthopedic prosthetic devices, implants and grafts, initial encounter: Secondary | ICD-10-CM | POA: Diagnosis not present

## 2014-05-25 MED ORDER — HYDROCODONE-ACETAMINOPHEN 5-325 MG PO TABS: 1.0000 | ORAL_TABLET | ORAL | Status: AC | PRN

## 2014-05-25 MED ORDER — ASPIRIN 81 MG PO CHEW: 81.0000 mg | CHEWABLE_TABLET | Freq: Every day | ORAL | Status: AC

## 2014-05-25 MED ORDER — METHOCARBAMOL 500 MG PO TABS: 500.0000 mg | ORAL_TABLET | Freq: Four times a day (QID) | ORAL | Status: AC | PRN

## 2014-05-25 MED ORDER — TRAMADOL HCL 50 MG PO TABS: 50.0000 mg | ORAL_TABLET | Freq: Four times a day (QID) | ORAL | Status: AC | PRN

## 2014-05-25 NOTE — Discharge Summary (Signed)
Physician Discharge Summary   Patient ID: Sylvia Thomas MRN: [id number] DOB/AGE: Jun 20, 1945 69 y.o.  Admit date: 05/24/2014 Discharge date: 05/25/2014  Primary Diagnosis:   PAINFUL HARDWARE OF RIGHT HIP  Admission Diagnoses:  Past Medical History  Diagnosis Date  . Complication of anesthesia 2011    stopped breathing in recovery and was put on CPAP machine in PACU after hip surgery  . CHF (congestive heart failure)   . Hypertension   . Depression   . Anxiety   . GERD (gastroesophageal reflux disease)   . Sleep apnea     does not use CPAP  . Headache(784.0)     migraines in past but not past several years  . Fibromyalgia   . Arthritis     Right,knees,Right shulder  . Presence of permanent cardiac pacemaker     December 15, 2013 by Dr. 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 She had cables placed for peritrochanteric fracture. She is having pain now related to the cables. She presents now for removal of the cables.   Laboratory Data: Admission on 07/08/2013, Discharged on 07/11/2013  Component Date Value Ref Range Status  . ABO/RH(D) 07/08/2013 O POS   Final  . Antibody Screen 07/08/2013 NEG   Final  . Sample Expiration 07/08/2013 07/11/2013   Final  . MRSA, PCR 07/08/2013 NEGATIVE  NEGATIVE Final  . Staphylococcus aureus 07/08/2013 NEGATIVE  NEGATIVE Final   Comment:                                 The Xpert SA Assay (FDA                          approved for NASAL specimens                          in patients over 63 years of age),                          is one component of                          a comprehensive surveillance                          program.  Test performance has   been validated by [employer] for patients greater                          than or equal to 78 year old.                          It is not intended                          to diagnose infection nor to  guide or monitor treatment.  . ABO/RH(D) 07/08/2013 O POS   Final  . WBC 07/09/2013 12.2* 4.0 - 10.5 K/uL Final  . RBC 07/09/2013 3.82* 3.87 - 5.11 MIL/uL Final  . Hemoglobin 07/09/2013 11.1* 12.0 - 15.0 g/dL Final  . HCT 07/09/2013 34.6* 36.0 - 46.0 % Final  . MCV 07/09/2013 90.6  78.0 - 100.0 fL Final  . MCH 07/09/2013 29.1  26.0 - 34.0 pg Final  . MCHC 07/09/2013 32.1  30.0 - 36.0 g/dL Final  . RDW 07/09/2013 13.7  11.5 - 15.5 % Final  . Platelets 07/09/2013 188  150 - 400 K/uL Final  . Sodium 07/09/2013 134* 137 - 147 mEq/L Final  . Potassium 07/09/2013 4.4  3.7 - 5.3 mEq/L Final  . Chloride 07/09/2013 100  96 - 112 mEq/L Final  . CO2 07/09/2013 24  19 - 32 mEq/L Final  . Glucose, Bld 07/09/2013 188* 70 - 99 mg/dL Final  . BUN 07/09/2013 12  6 - 23 mg/dL Final  . Creatinine, Ser 07/09/2013 0.69  0.50 - 1.10 mg/dL Final  . Calcium 07/09/2013 8.5  8.4 - 10.5 mg/dL Final  . GFR calc non Af Amer 07/09/2013 89* >90 mL/min Final  . GFR calc Af Amer 07/09/2013 >90  >90 mL/min Final   Comment: (NOTE)                          The eGFR has been calculated using the CKD EPI equation.                          This calculation has not been validated in all clinical situations.                          eGFR's persistently <90 mL/min signify possible Chronic Kidney                          Disease.  . WBC 07/10/2013 12.4* 4.0 - 10.5 K/uL Final  . RBC 07/10/2013 3.74* 3.87 - 5.11 MIL/uL Final  . Hemoglobin 07/10/2013 10.9* 12.0 - 15.0 g/dL Final  . HCT 07/10/2013 33.7* 36.0 - 46.0 % Final  . MCV 07/10/2013 90.1  78.0 - 100.0 fL Final  . MCH 07/10/2013 29.1  26.0 - 34.0 pg Final  . MCHC 07/10/2013 32.3  30.0 - 36.0 g/dL Final  . RDW  07/10/2013 13.9  11.5 - 15.5 % Final  . Platelets 07/10/2013 203  150 - 400 K/uL Final  . Sodium 07/10/2013 139  137 - 147 mEq/L Final  . Potassium 07/10/2013 4.5  3.7 - 5.3 mEq/L Final  . Chloride 07/10/2013 101  96 - 112 mEq/L Final  . CO2 07/10/2013 28  19 - 32 mEq/L Final  . Glucose, Bld 07/10/2013 157* 70 - 99 mg/dL Final  . BUN 07/10/2013 12  6 - 23 mg/dL Final  . Creatinine, Ser 07/10/2013 0.70  0.50 - 1.10 mg/dL Final  . Calcium 07/10/2013 9.0  8.4 - 10.5 mg/dL Final  . GFR calc non Af Amer 07/10/2013 89* >90 mL/min Final  . GFR calc Af Amer 07/10/2013 >90  >90 mL/min Final   Comment: (NOTE)                          The eGFR has been calculated using the CKD EPI  equation.                          This calculation has not been validated in all clinical situations.                          eGFR's persistently <90 mL/min signify possible Chronic Kidney                          Disease.  . WBC 07/11/2013 9.7  4.0 - 10.5 K/uL Final  . RBC 07/11/2013 3.44* 3.87 - 5.11 MIL/uL Final  . Hemoglobin 07/11/2013 10.0* 12.0 - 15.0 g/dL Final  . HCT 07/11/2013 30.7* 36.0 - 46.0 % Final  . MCV 07/11/2013 89.2  78.0 - 100.0 fL Final  . MCH 07/11/2013 29.1  26.0 - 34.0 pg Final  . MCHC 07/11/2013 32.6  30.0 - 36.0 g/dL Final  . RDW 07/11/2013 14.3  11.5 - 15.5 % Final  . Platelets 07/11/2013 185  150 - 400 K/uL Final    Recent Labs  05/24/14 1200  HGB 12.6    Recent Labs  05/24/14 1200  WBC 6.2  RBC 5.15*  HCT 40.6  PLT 241    Recent Labs  05/24/14 1200  NA 139  K 4.3  CL 104  CO2 30  BUN 18  CREATININE 0.88  GLUCOSE 87  CALCIUM 9.1   No results for input(s): LABPT, INR in the last 72 hours.  X-Rays:No results found.  EKG:No orders found for this or any previous visit.   Hospital Course: Patient was admitted to [facility] and taken to the OR and underwent the above state procedure without complications.  Patient tolerated the procedure well and was later  transferred to the recovery room and then to the orthopaedic floor for postoperative care.  They were given PO and IV analgesics for pain control following their surgery.  They were given 24 hours of postoperative antibiotics.   PT was consulted postop to assist with mobility and transfers.  The patient was allowed to be WBAT following the procedure. Discharge planning was consulted to help with postop disposition and equipment needs.  Patient had a good night on the evening of surgery and started to get up OOB on day one. Patient was seen in rounds and was ready to go home on day one.  They were given discharge instructions and dressing directions.  They were instructed on when to follow up in the office with Dr. Wynelle Link in two weeks.  Discharge home Diet - Cardiac diet Follow up - in 2 weeks Activity - WBAT Disposition - Home Condition Upon Discharge - Good D/C Meds - See DC Summary DVT Prophylaxis - Aspirin 81 mg daily for three weeks      Discharge Instructions    Call MD / Call 911    Complete by:  As directed   If you experience chest pain or shortness of breath, CALL 911 and be transported to the hospital emergency room.  If you develope a fever above 101 F, pus (white drainage) or increased drainage or redness at the wound, or calf pain, call your surgeon's office.     Change dressing    Complete by:  As directed   You may start changing your dressing dressing daily on Wednesday 05/26/2014 with sterile 4 x 4 inch gauze dressing and paper tape.  Do not  submerge the incision under water.     Constipation Prevention    Complete by:  As directed   Drink plenty of fluids.  Prune juice may be helpful.  You may use a stool softener, such as Colace (over the counter) 100 mg twice a day.  Use MiraLax (over the counter) for constipation as needed.     Diet - low sodium heart healthy    Complete by:  As directed      Discharge instructions    Complete by:  As directed   Pick up stool softner  and laxative for home use following surgery while on pain medications. Do not submerge incision under water. Please use good hand washing techniques while changing dressing each day. May shower starting three days after surgery. Please use a clean towel to pat the incision dry following showers. Continue to use ice for pain and swelling after surgery. Do not use any lotions or creams on the incision until instructed by your surgeon.  Take an 81 mg aspirin daily for three weeks.     Do not sit on low chairs, stoools or toilet seats, as it may be difficult to get up from low surfaces    Complete by:  As directed      Driving restrictions    Complete by:  As directed   No driving until released by the physician.     Follow the hip precautions as taught in Physical Therapy    Complete by:  As directed      Increase activity slowly as tolerated    Complete by:  As directed      Lifting restrictions    Complete by:  As directed   No lifting until released by the physician.     Patient may shower    Complete by:  As directed   You may shower without a dressing once there is no drainage.  Do not wash over the wound.  If drainage remains, do not shower until drainage stops.     TED hose    Complete by:  As directed   Use stockings (TED hose) for 3 weeks on both leg(s).  You may remove them at night for sleeping.     Weight bearing as tolerated    Complete by:  As directed             Medication List    STOP taking these medications        cholecalciferol 1000 UNITS tablet  Commonly known as:  VITAMIN D     oxyCODONE 5 MG immediate release tablet  Commonly known as:  Oxy IR/ROXICODONE     rivaroxaban 10 MG Tabs tablet  Commonly known as:  XARELTO      TAKE these medications        acetaminophen 500 MG tablet  Commonly known as:  TYLENOL  Take 1,000 mg by mouth every 6 (six) hours as needed for moderate pain.     aspirin 81 MG chewable tablet  Chew 1 tablet (81 mg total)  by mouth daily. Take one 81 mg aspirin daily for three weeks and then discontinue.     carvedilol 3.125 MG tablet  Commonly known as:  COREG  Take 3.125 mg by mouth 2 (two) times daily with a meal.     cyclobenzaprine 10 MG tablet  Commonly known as:  FLEXERIL  Take 10 mg by mouth 3 (three) times daily as needed for muscle spasms.  DULoxetine 60 MG capsule  Commonly known as:  CYMBALTA  Take 60 mg by mouth every morning.     eszopiclone 3 MG Tabs  Generic drug:  Eszopiclone  Take 3 mg by mouth at bedtime. Take immediately before bedtime     HYDROcodone-acetaminophen 5-325 MG per tablet  Commonly known as:  NORCO/VICODIN  Take 1-2 tablets by mouth every 4 (four) hours as needed (breakthrough pain).     methocarbamol 500 MG tablet  Commonly known as:  ROBAXIN  Take 1 tablet (500 mg total) by mouth every 6 (six) hours as needed for muscle spasms.     multivitamin with minerals Tabs tablet  Take 1 tablet by mouth daily.     omeprazole 20 MG capsule  Commonly known as:  PRILOSEC  Take 20 mg by mouth daily.     pravastatin 20 MG tablet  Commonly known as:  PRAVACHOL  Take 20 mg by mouth at bedtime.     pregabalin 75 MG capsule  Commonly known as:  LYRICA  Take 75-225 mg by mouth 2 (two) times daily. 60m in the am and 225 mg in the pm     spironolactone 25 MG tablet  Commonly known as:  ALDACTONE  Take 25 mg by mouth daily.     traMADol 50 MG tablet  Commonly known as:  ULTRAM  Take 1-2 tablets (50-100 mg total) by mouth every 6 (six) hours as needed (mild to moderate pain).     vitamin B-12 1000 MCG tablet  Commonly known as:  CYANOCOBALAMIN  Take 1,000 mcg by mouth daily.       Follow-up Information    Follow up with AGearlean Alf MD. Schedule an appointment as soon as possible for a visit in 2 weeks.   Specialty:  Orthopedic Surgery   Why:  Call office at [phone] to setup follow up appointment with Dr. AWynelle Linkin two weeks.   Contact information:   [phone]      Signed: DArlee Muslim PA-C Orthopaedic Surgery 05/25/2014, 7:58 AM

## 2014-05-25 NOTE — Discharge Instructions (Signed)
Pick up stool softner and laxative for home use following surgery while on pain medications. Do not submerge incision under water. Please use good hand washing techniques while changing dressing each day. May shower starting three days after surgery. Please use a clean towel to pat the incision dry following showers. Continue to use ice for pain and swelling after surgery. Do not use any lotions or creams on the incision until instructed by your surgeon.  Take an 81 mg aspirin daily for three weeks.

## 2014-05-25 NOTE — Progress Notes (Signed)
   Subjective: 1 Day Post-Op Procedure(s) (LRB): HARDWARE REMOVAL RIGHT HIP (Right) Patient reports pain as mild.   Patient seen in rounds with Dr. Lequita HaltAluisio. Patient is well, but has had some minor complaints of pain in the hip, requiring pain medications Patient is ready to go home today  Objective: Vital signs in last 24 hours: Temp:  [97.5 F (36.4 C)-98.4 F (36.9 C)] 98 F (36.7 C) (04/19 0637) Pulse Rate:  [73-103] 79 (04/19 0637) Resp:  [7-21] 16 (04/19 0637) BP: (74-138)/(45-93) 138/67 mmHg (04/19 0637) SpO2:  [99 %-100 %] 100 % (04/19 0637) Weight:  [96.616 kg (213 lb)] 96.616 kg (213 lb) (04/18 1600)  Intake/Output from previous day:  Intake/Output Summary (Last 24 hours) at 05/25/14 0749 Last data filed at 05/25/14 0650  Gross per 24 hour  Intake 3422.5 ml  Output   1325 ml  Net 2097.5 ml   Labs:  Recent Labs  05/24/14 1200  HGB 12.6    Recent Labs  05/24/14 1200  WBC 6.2  RBC 5.15*  HCT 40.6  PLT 241    Recent Labs  05/24/14 1200  NA 139  K 4.3  CL 104  CO2 30  BUN 18  CREATININE 0.88  GLUCOSE 87  CALCIUM 9.1   No results for input(s): LABPT, INR in the last 72 hours.  EXAM: General - Patient is Alert, Appropriate and Oriented Extremity - Neurovascular intact Sensation intact distally Dorsiflexion/Plantar flexion intact Dressing - clean, dry, no drainage Motor Function - intact, moving foot and toes well on exam.   Assessment/Plan: 1 Day Post-Op Procedure(s) (LRB): HARDWARE REMOVAL RIGHT HIP (Right) Procedure(s) (LRB): HARDWARE REMOVAL RIGHT HIP (Right) Past Medical History  Diagnosis Date  . Complication of anesthesia 2011    stopped breathing in recovery and was put on CPAP machine in PACU after hip surgery  . CHF (congestive heart failure)   . Hypertension   . Depression   . Anxiety   . GERD (gastroesophageal reflux disease)   . Sleep apnea     does not use CPAP  . Headache(784.0)     migraines in past but not past  several years  . Fibromyalgia   . Arthritis     Right,knees,Right shulder  . Presence of permanent cardiac pacemaker     December 15, 2013 by Dr. Ranelle [place]ysterMusser in [place]   Principal Problem:   Painful orthopaedic hardware  Estimated body mass index is 34.4 kg/(m^2) as calculated from the following:   Height as of this encounter: 5\' 6"  (1.676 m).   Weight as of this encounter: 96.616 kg (213 lb). Discharge home Diet - Cardiac diet Follow up - in 2 weeks Activity - WBAT Disposition - Home Condition Upon Discharge - Good D/C Meds - See DC Summary DVT Prophylaxis - Aspirin 81 mg daily for three weeks  Avel Peacerew Perkins, PA-C Orthopaedic Surgery 05/25/2014, 7:49 AM

## 2014-05-25 NOTE — Op Note (Signed)
NAMMarland Kitchen:  Sylvia Thomas, [name]             ACCOUNT NO.:  [id number]  MEDICAL RECORD NO.:  [id number]  LOCATION:  1602                         [facility]  PHYSICIAN:  Ollen GrossFrank Harvie Morua, M.D.    DATE OF BIRTH:  1945/12/28  DATE OF PROCEDURE:  05/24/2014 DATE OF DISCHARGE:                              OPERATIVE REPORT   PREOPERATIVE DIAGNOSIS:  Painful hardware, right hip.  POSTOPERATIVE DIAGNOSIS:  Painful hardware, right hip.  PROCEDURE:  Hardware removal, right hip.  SURGEON:  Ollen GrossFrank Faraz Ponciano, M.D.  ASSISTANT:  No assistant.  ANESTHESIA:  Spinal.  ESTIMATED BLOOD LOSS:  Minimal.  DRAINS:  None.  COMPLICATIONS:  None.  CONDITION:  Stable to recovery.  CLINICAL NOTE:  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  She had cables placed for peritrochanteric fracture.  She is having pain now related to the cables.  She presents now for removal of the cables.  PROCEDURE IN DETAIL:  After successful administration of spinal anesthetic, the patient was placed in the left lateral decubitus position with the right side up and held with the hip positioner.  Right lower extremity was isolated from her perineum with plastic drapes and prepped and draped in usual sterile fashion.  About a 2-3-inch incision was made, centered over the trochanter.  The skin was cut with a 10- blade through subcutaneous tissue to the fascia lata, which was incised in line with the skin incision.  The superior cable was identified and soft tissue removed from around that cables cut and then removed.  We then identified the second cable and did the same thing by isolating the cable, cutting it and removing it as well as the clamp.  There was an old hematoma surrounding the cables and some fluid surrounding the cables consistent with it irrigating the tissues.  I then thoroughly irrigated the wound bed with saline solution and closed the fascia  with interrupted #1 Vicryl, deep subcu with interrupted #1 Vicryl, superficial subcu with interrupted 2-0 Vicryl and then subcuticular was closed with a running 4-0 Monocryl. Prior to closure, I had injected the subcu tissues, fascia and deep tissues with a total of 30 mL of 0.25% Marcaine.  Once closed, the incision was cleaned and dried, and Steri-Strips and a bulky sterile dressing applied.  She was awakened and transported to the recovery room in stable condition.     Ollen GrossFrank Eboney Claybrook, M.D.     FA/MEDQ  D:  05/24/2014  T:  05/25/2014  Job:  [id number]

## 2016-05-09 IMAGING — CR DG HIP 1V PORT*R*
1 series · 1 of 1 positions shown · non-contrast
Comparison: None.

CLINICAL DATA: Postop right hip revision

EXAM:
PORTABLE RIGHT HIP - 1 VIEW

[AP]
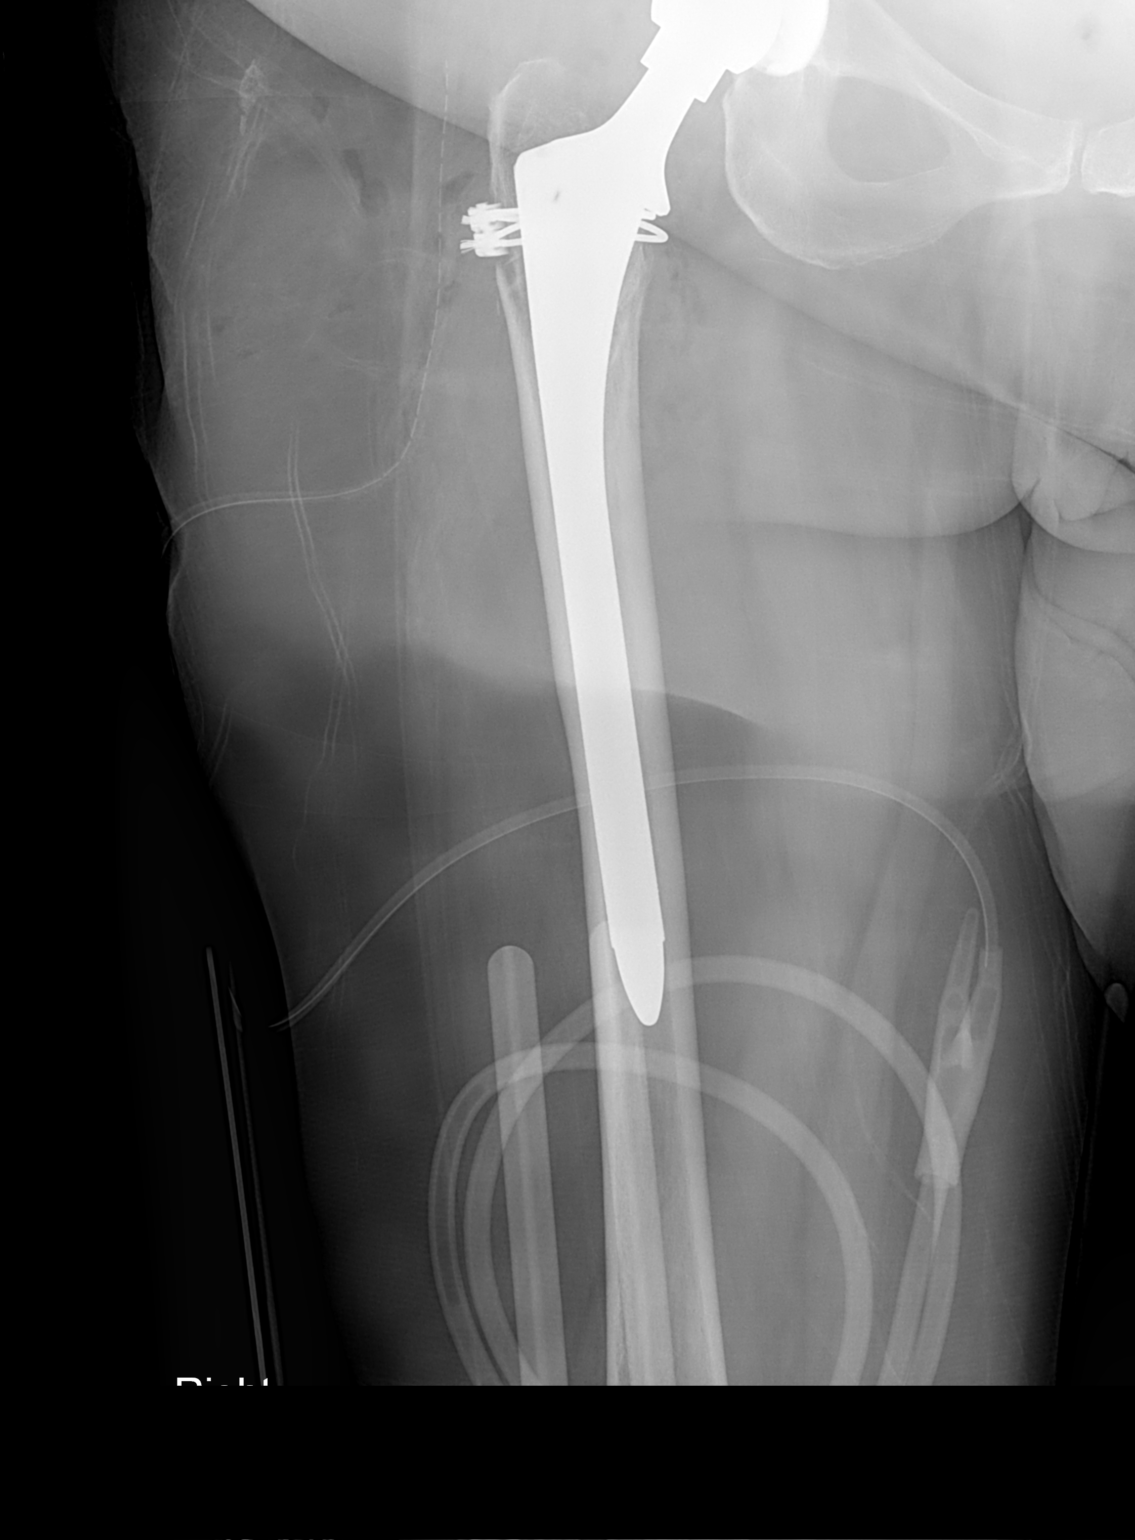

[1 of 1 positions shown; findings below may reference images not displayed]

FINDINGS: Single frontal view of the proximal/mid femur demonstrates a right
hip arthroplasty, incompletely visualized. Proximal cerclage wires.
Distal femoral stem is in satisfactory position.

Associated subcutaneous gas and surgical drain.
IMPRESSION: Right hip arthroplasty, incompletely visualized, but without
evidence of complication.

## 2016-05-09 IMAGING — CR DG CHEST 2V
2 series · 2 of 2 positions shown · non-contrast
Comparison: None.

CLINICAL DATA: Preop rt hip femoral vs rt total hip / hx HBP

EXAM:
CHEST  2 VIEW

[w chest pa]
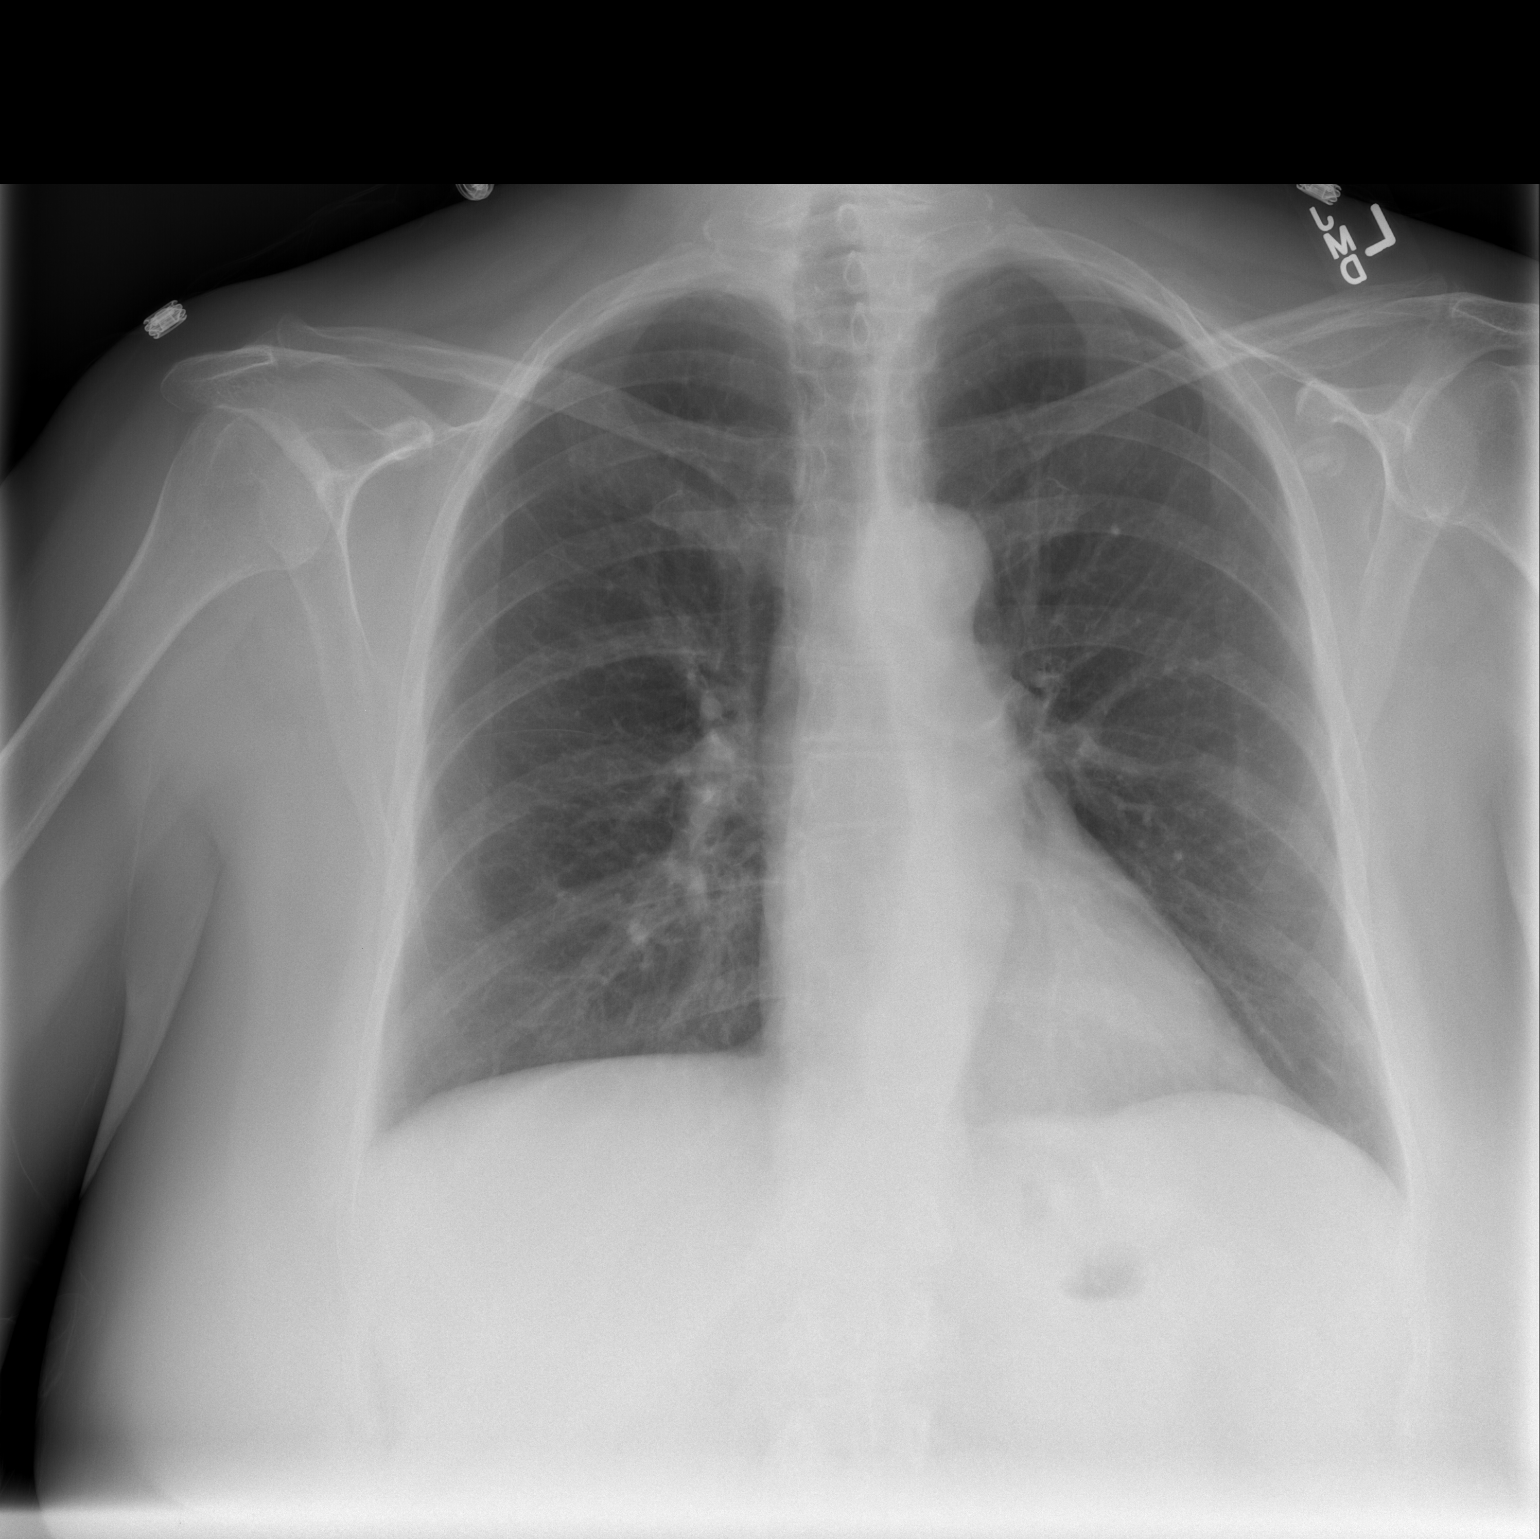

[w chest lat]
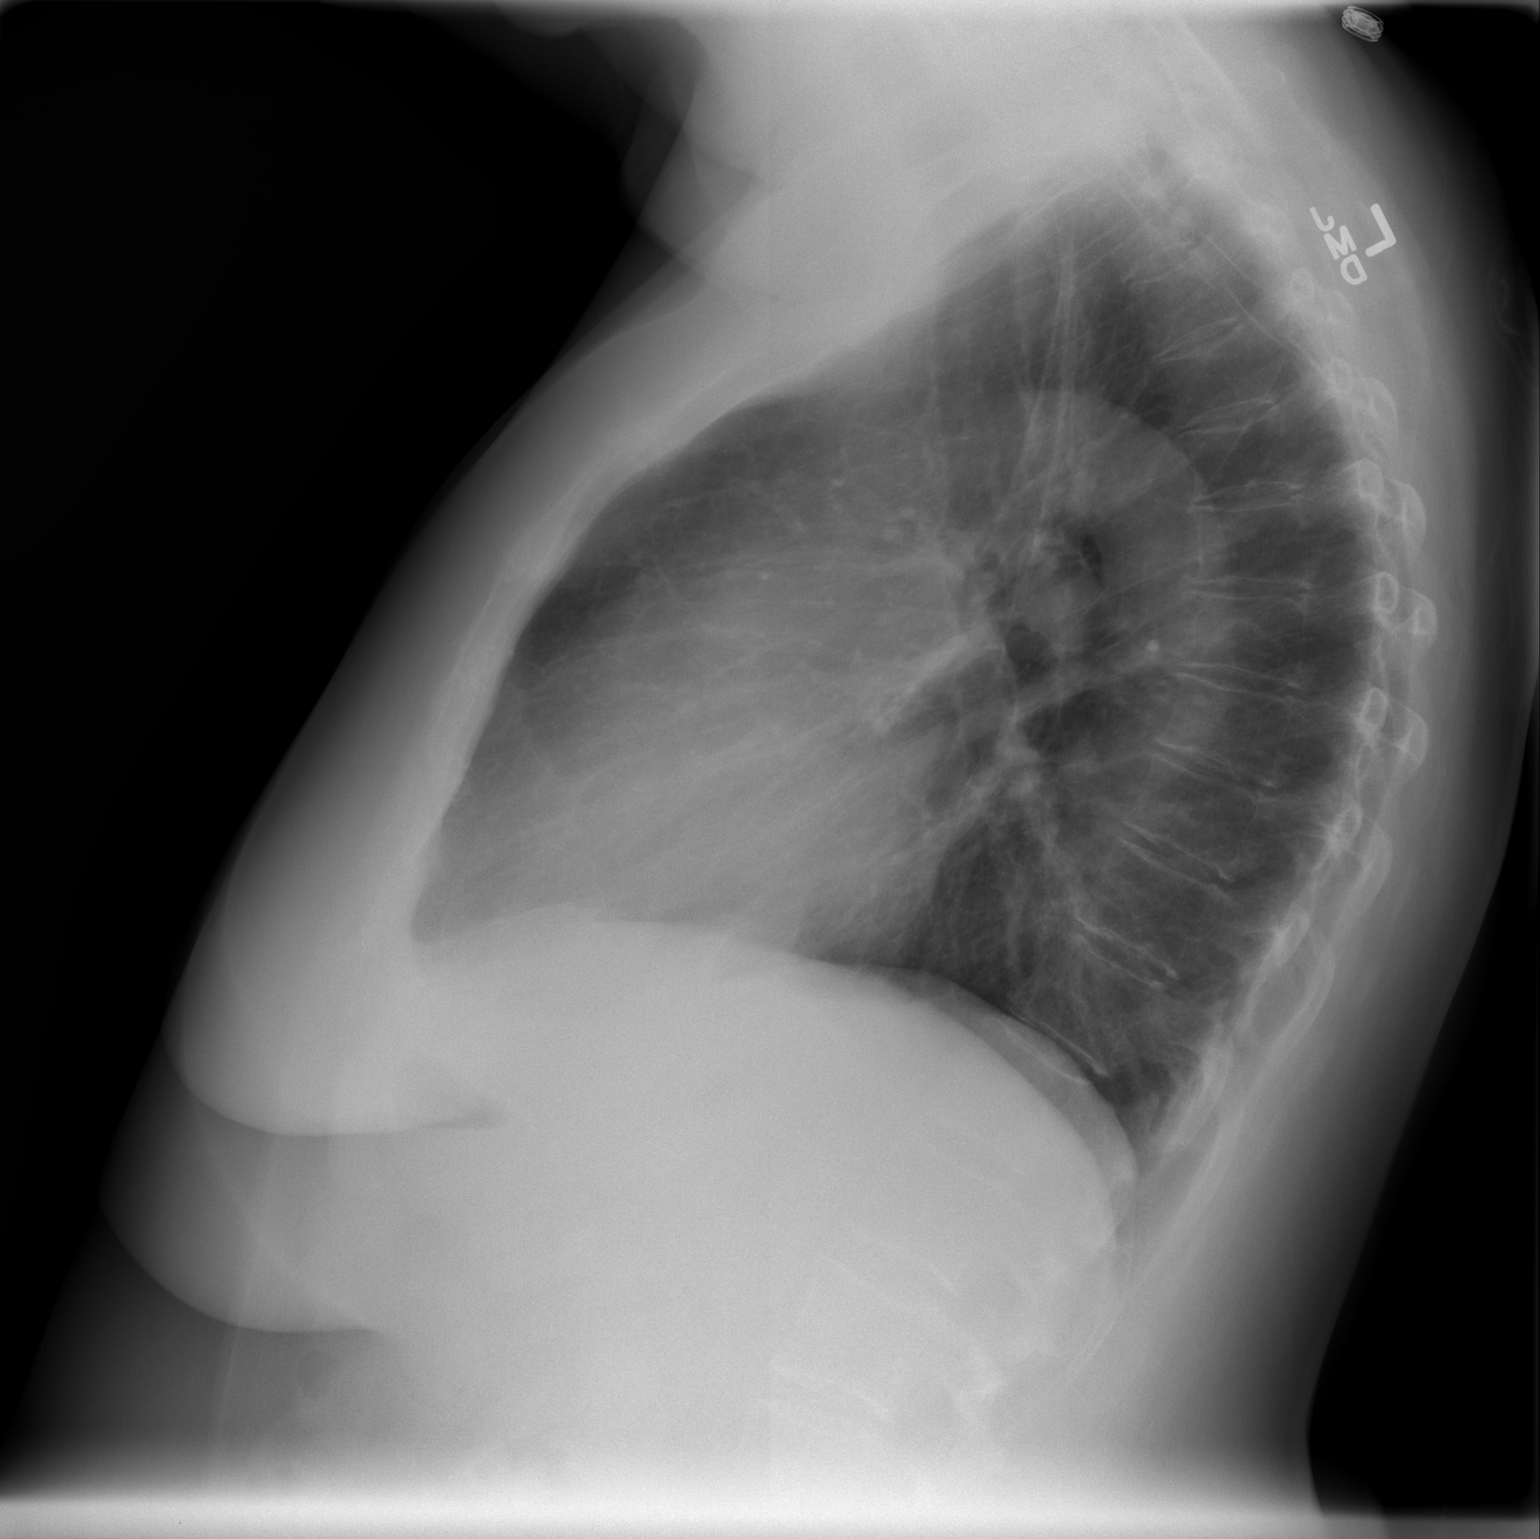

[2 of 2 positions shown; findings below may reference images not displayed]

FINDINGS: The heart size and mediastinal contours are within normal limits.
Both lungs are clear. The visualized skeletal structures are
unremarkable.
IMPRESSION: No active cardiopulmonary disease.

## 2016-05-09 IMAGING — CR DG HIP COMPLETE 2+V*R*
3 series · 3 of 3 positions shown · non-contrast
Comparison: None.

CLINICAL DATA: Loose right femoral component of total hip
repalcement

EXAM:
RIGHT HIP - COMPLETE 2+ VIEW

[t pelvis a.p.]
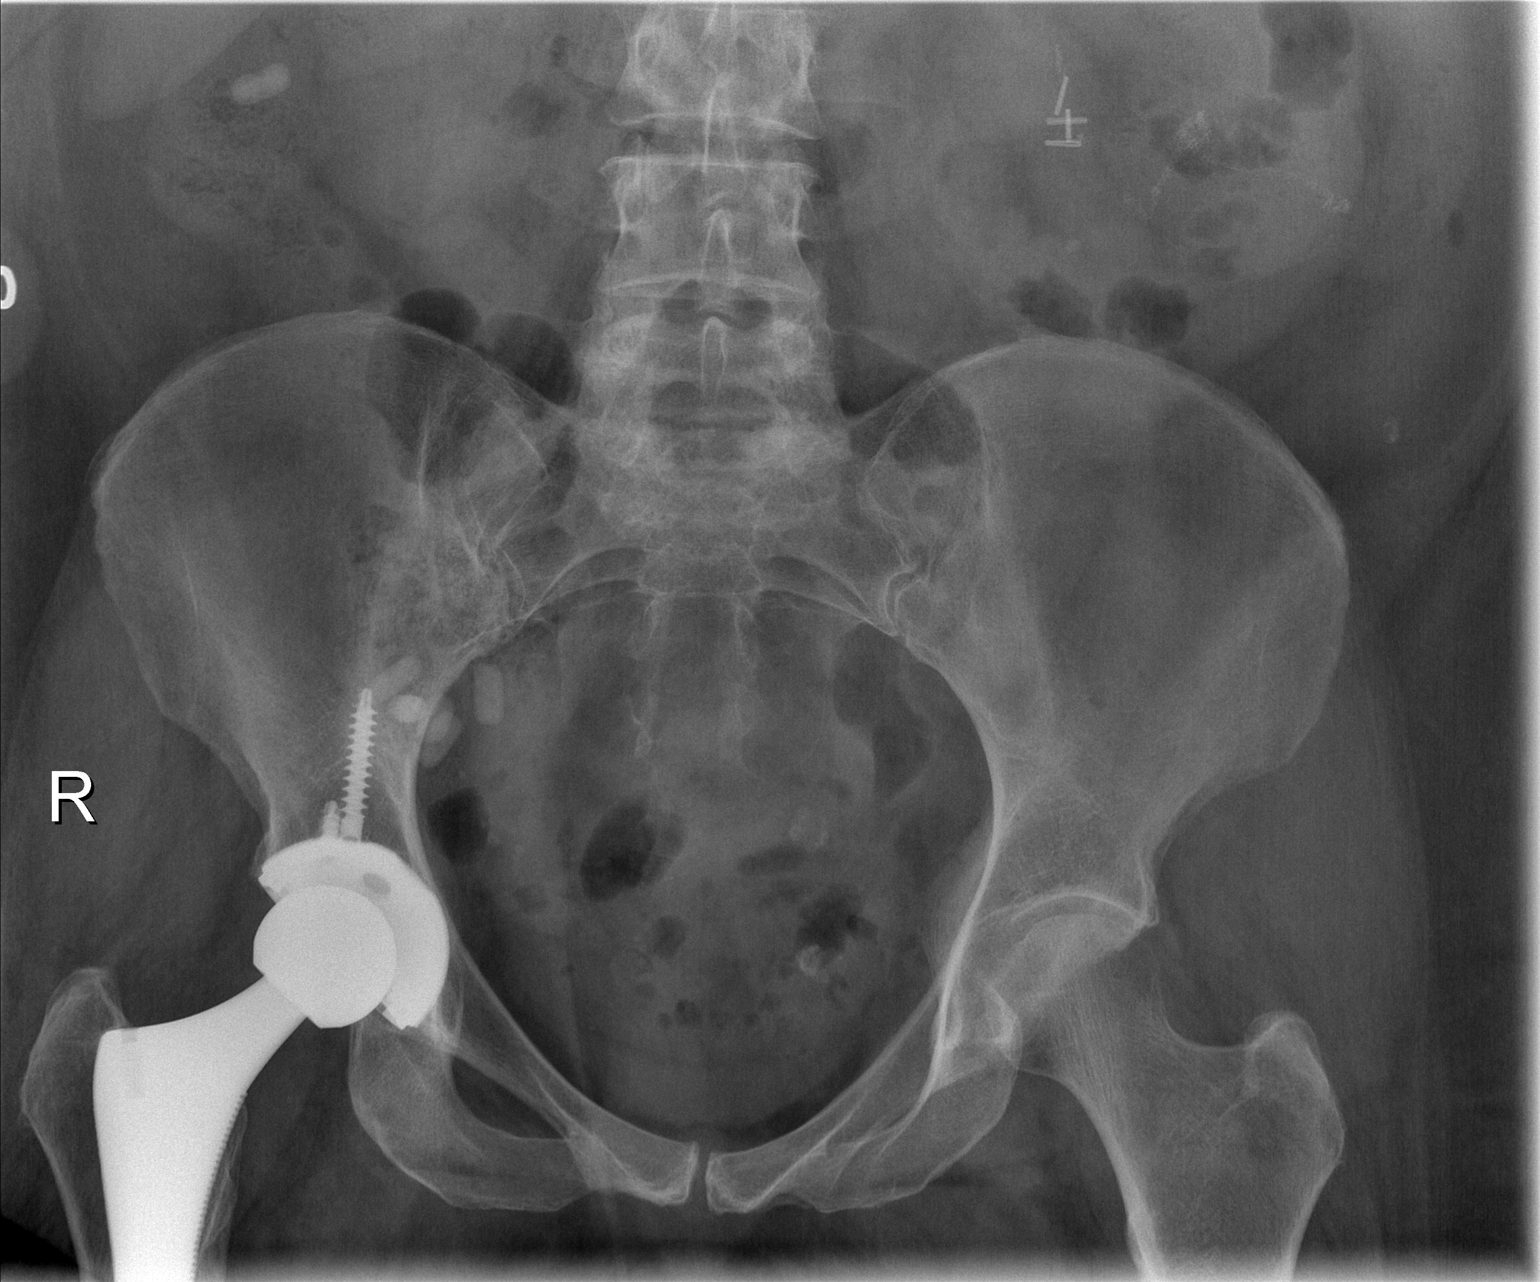

[t hip ap right]
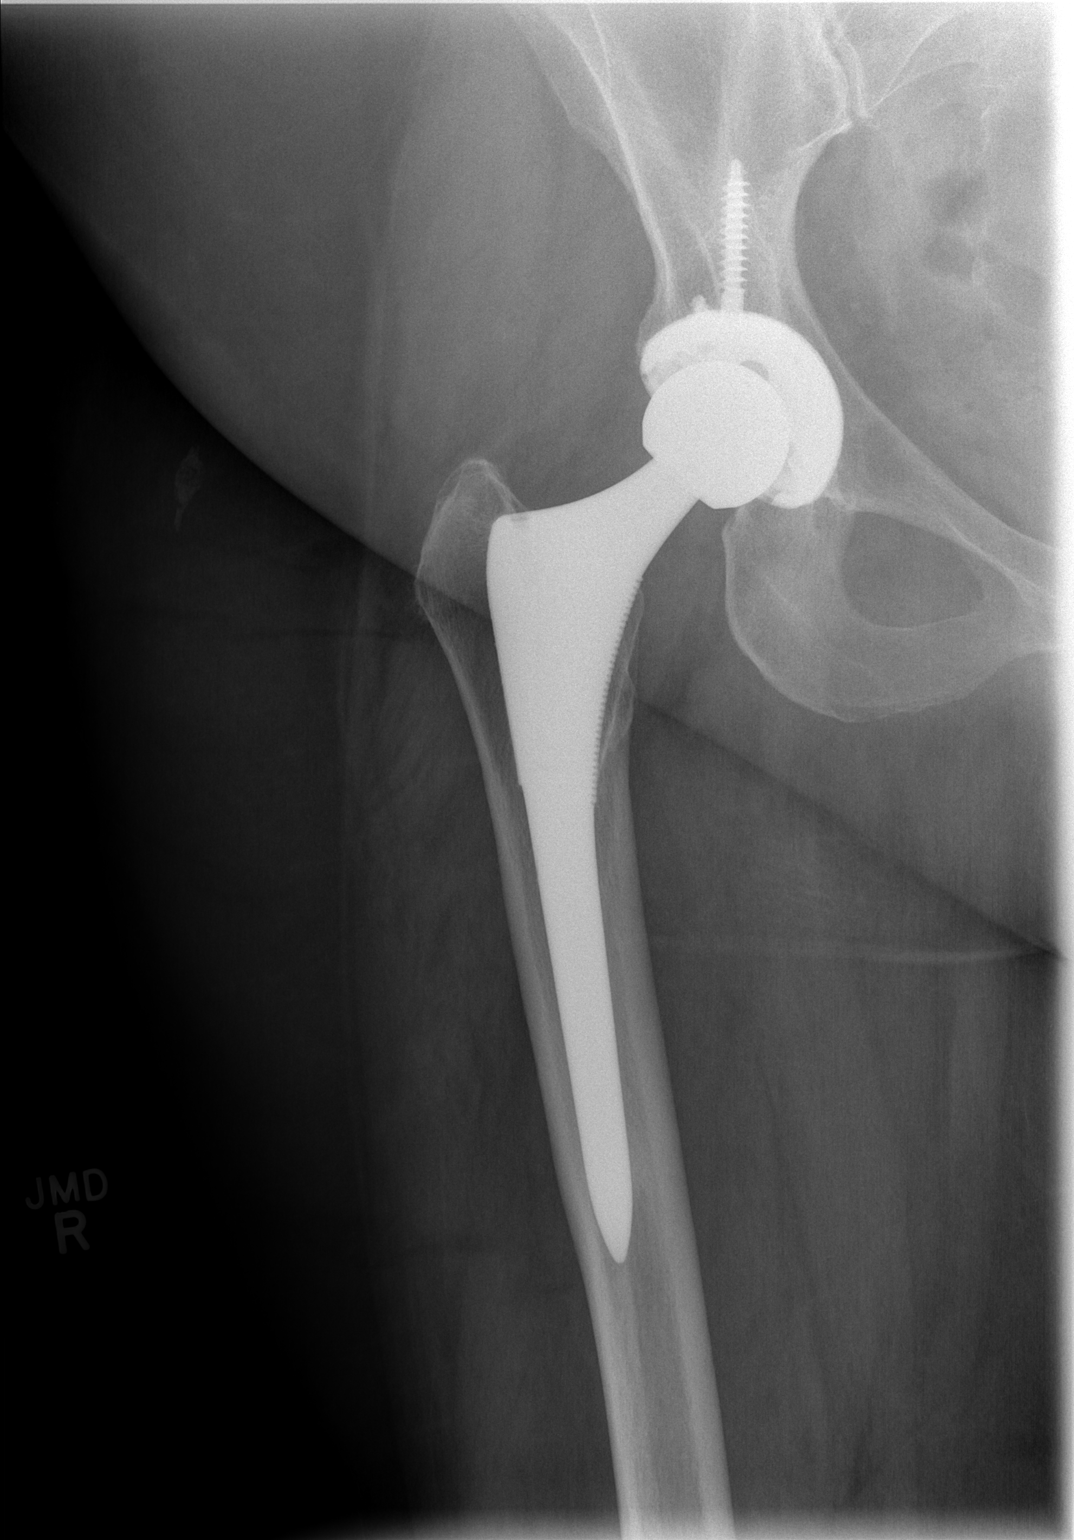

[t hip frog leg right]
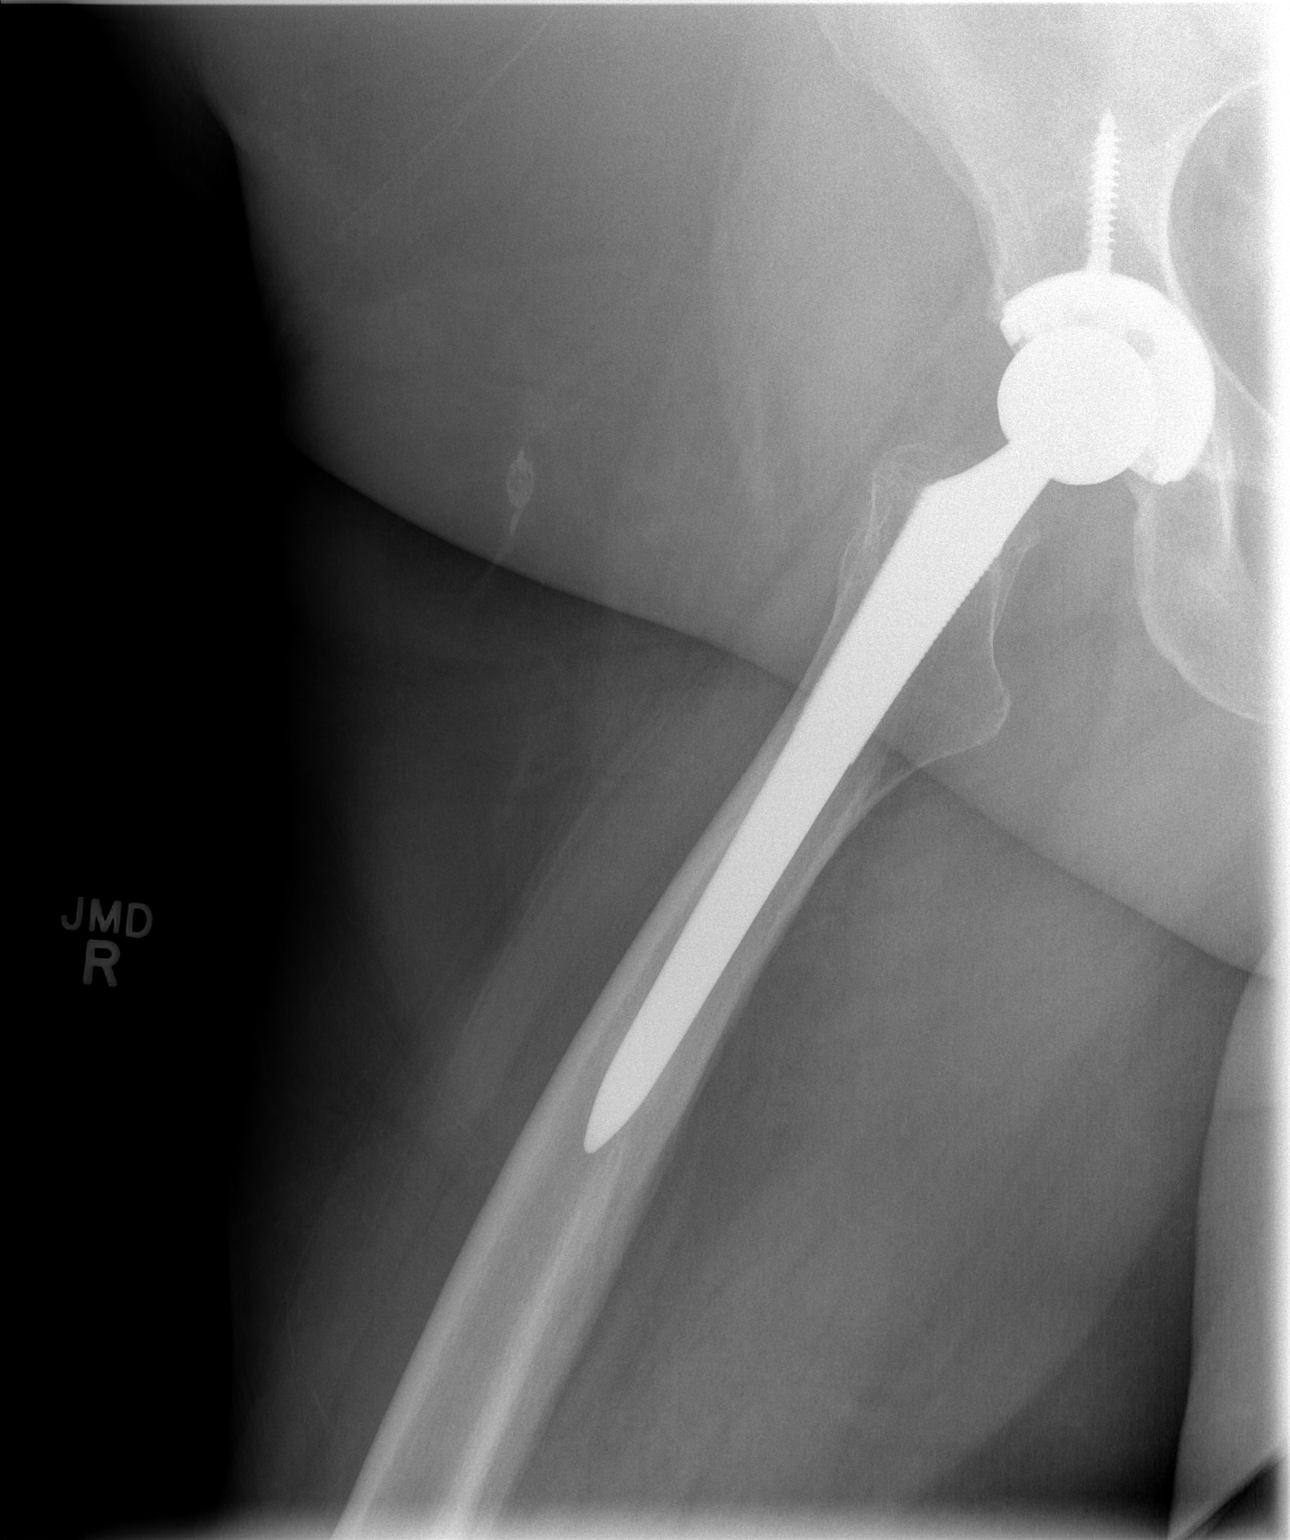

[3 of 3 positions shown; findings below may reference images not displayed]

FINDINGS: Right hip arthroplasty appreciated. Hardware and native osseous
structures appear intact. Soft tissues are unremarkable.
IMPRESSION: No acute osseous or hardware abnormalities.
